# Patient Record
Sex: Female | Born: 1955 | Race: Asian | Hispanic: No | Marital: Married | Smoking: Former smoker
Health system: Southern US, Community
[De-identification: ages and names within clinical notes are randomized; demographics above are authoritative.]

## PROBLEM LIST (undated history)

## (undated) DIAGNOSIS — J301 Allergic rhinitis due to pollen: Secondary | ICD-10-CM

## (undated) DIAGNOSIS — I10 Essential (primary) hypertension: Secondary | ICD-10-CM

## (undated) HISTORY — PX: ABDOMINAL HYSTERECTOMY: SHX81

## (undated) HISTORY — PX: TUBAL LIGATION: SHX77

## (undated) HISTORY — DX: Essential (primary) hypertension: I10

## (undated) HISTORY — DX: Allergic rhinitis due to pollen: J30.1

---

## 1985-10-14 HISTORY — PX: ANKLE FRACTURE SURGERY: SHX122

## 2008-10-14 DIAGNOSIS — I10 Essential (primary) hypertension: Secondary | ICD-10-CM

## 2008-10-14 HISTORY — DX: Essential (primary) hypertension: I10

## 2012-10-14 HISTORY — PX: OTHER SURGICAL HISTORY: SHX169

## 2012-10-14 HISTORY — PX: BASAL CELL CARCINOMA EXCISION: SHX1214

## 2013-02-10 HISTORY — PX: OTHER SURGICAL HISTORY: SHX169

## 2014-10-14 HISTORY — PX: CARPAL TUNNEL RELEASE: SHX101

## 2015-06-23 ENCOUNTER — Telehealth: Payer: Self-pay | Admitting: Internal Medicine

## 2015-06-23 NOTE — Telephone Encounter (Signed)
Pt called stated she thinks she has a fibroid tumer or her right shoulder blade. It is causing her problems when she tries to sleep and impairing right arm movement and when she moves her head forward she can feel something pulling Wanted to know if she could be see sooner or is it ok to wait till oct to have this taken care of  i asked Sukaina t she checked with Mearl Latin and Rena  re[facility] to [facility] on [address]  i told pt that and gave her address and phone number to the office on [address]  She wanted to know if she still could see dr Silvio Pate in oct. i told patient her appointment was here oct but that [facility] recommended her to go to urgent care because this is a more complicated acute need prior to oct and that dr Silvio Pate could see what they did at urgent care Dr Silvio Pate and Karena Addison out of office today

## 2015-06-23 NOTE — Telephone Encounter (Signed)
Please check with her on Monday We can try to get her in sooner if she still has an issue that can't wait till October

## 2015-06-24 ENCOUNTER — Ambulatory Visit (INDEPENDENT_AMBULATORY_CARE_PROVIDER_SITE_OTHER): Payer: Self-pay | Admitting: Family Medicine

## 2015-06-24 VITALS — BP 150/84 | HR 73 | Temp 98.5°F | Resp 18 | Ht 60.5 in | Wt 148.6 lb

## 2015-06-24 DIAGNOSIS — IMO0001 Reserved for inherently not codable concepts without codable children: Secondary | ICD-10-CM

## 2015-06-24 DIAGNOSIS — M62830 Muscle spasm of back: Secondary | ICD-10-CM

## 2015-06-24 DIAGNOSIS — R030 Elevated blood-pressure reading, without diagnosis of hypertension: Secondary | ICD-10-CM

## 2015-06-24 NOTE — Patient Instructions (Signed)
Catherine Dalton for massage therapy. Continue aspiriin as needed for pain. Return if not getting better in 2-3 months. Follow up with your PCP in October. Talk to them about blood pressure and dermatology referral.

## 2015-06-24 NOTE — Progress Notes (Signed)
[facility] [address], [place] [address] [phone]  Date:  06/24/2015   Name:  Catherine Dalton   DOB:  04-20-1956   MRN:  [id number]  PCP:  No primary care provider on file.    Chief Complaint: Shoulder Pain   History of Present Illness:  This is a 59 y.o. female who is presenting with a "knot" over her right shoulder blade x 6-8 years. She thinks this could be a "fibroid tumor". When she first noticed the knot, there was no associated pain. Over the past 3-4 years this area has become painful. Pain is not just located to knot, pain is more under right shoulder blade. She feels she can't quite locate it with her hands. Over the past 2 years she wakes every night from the pain. During the day the pain is described as dull, at night the pain is throbbing. Flexing neck produces a "pull". She feels the knot has grown "only a little" since she first noticed it years ago. She takes aspirin as needed, usually at night to help her sleep. She states "I am anti-medicine". 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 She always attributed the pain to work but now that she is not working, she is worried since the pain is still there. She is the primary caregiver for her aging mother and states this does cause her a lot of stress. She works in her garden every day and states this is a stress reliever for her.  Pt has elevated bp to 150/84. She states she was tried on medication for BP (maybe metoprolol) a couple years ago but made her too drowsy so she stopped. She does not want to take medication if she can help it. She is trying to lose some weight and eat healthier and hoping this will help. She has not had a CPE in almost 2 years. She is establishing care with a PCP, Dr. Silvio Pate, in 1 month. She has a BP monitor at home that she uses for her Mom. She denies CP, SOB, palpitations, headaches, dizziness, blurred vision.  Review of Systems:  Review of Systems See  HPI  There are no active problems to display for this patient.   Prior to Admission medications   Medication Sig Start Date End Date Taking? Authorizing Provider  aspirin 325 MG EC tablet Take 325 mg by mouth daily.   Yes Historical Provider, MD    No Known Allergies  Past Surgical History  Procedure Laterality Date  . Ankle fracture surgery  1987  . Carpal tunnel release    . Tubal ligation    . Abdominal hysterectomy      Social History  Substance Use Topics  . Smoking status: Former Research scientist (life sciences)  . Smokeless tobacco: None  . Alcohol Use: No    Family History  Problem Relation Age of Onset  . Heart disease Mother   . Stroke Mother   . Hypertension Mother   . Hyperlipidemia Mother   . Cancer Sister     Medication list has been reviewed and updated.  Physical Examination:  Physical Exam  Constitutional: She is oriented to person, place, and time. She appears well-developed and well-nourished. No distress.  HENT:  Head: Normocephalic and atraumatic.  Right Ear: Hearing normal.  Left Ear: Hearing normal.  Nose: Nose normal.  Eyes: Conjunctivae and lids are normal. Right eye exhibits no discharge. Left eye exhibits no discharge. No scleral icterus.  Neck:  Carotid bruit is not present.  Cardiovascular: Normal rate, regular rhythm, normal heart sounds and normal pulses.   No murmur heard. Pulmonary/Chest: Effort normal and breath sounds normal. No respiratory distress. She has no wheezes. She has no rhonchi. She has no rales.  Musculoskeletal:       Right shoulder: Normal. She exhibits normal range of motion.       Left shoulder: Normal. She exhibits normal range of motion.       Cervical back: Normal. She exhibits normal range of motion.       Thoracic back: She exhibits normal range of motion.       Lumbar back: Normal. She exhibits normal range of motion.  Right upper back with 1 cm soft sebaceous cyst. Small amount sebaceous material expressed from pore. Mildly  tender with palpation.  Mild tenderness at medial aspect of right scapula. Spasm present at medial border.   Neurological: She is alert and oriented to person, place, and time.  Skin: Skin is warm, dry and intact.  No LE edema  Psychiatric: She has a normal mood and affect. Her speech is normal and behavior is normal. Thought content normal.   BP 150/84 mmHg  Pulse 73  Temp(Src) 98.5 F (36.9 C) (Oral)  Resp 18  Ht 5' 0.5" (1.537 m)  Wt 148 lb 9.6 oz (67.405 kg)  BMI 28.53 kg/m2  SpO2 98%  Assessment and Plan:  1. Muscle spasm of back Discussed patient with Dr. Brigitte Pulse. Suspect patient holds tension between scapula and pain is d/t muscle spasm. She takes care of her mother which causes her a lot of stress. Unlikely that sebaceous cyst is causing pain and removal would likely be of benefit. She declined prescription for muscle relaxer or stronger NSAID. She will continue aspirin prn pain. We discussed benefits of massage therapy and acupuncture as she is more interested in homeopathic medicine. Follow up with PCP if pain not improving in 2-3 months.  2. Elevated BP Mildly elevated BP. Patient does not want to start medication at this time. She has a BP monitor at home and will start monitoring daily. She will focus on low salt diet, exercise and weight loss. She will bring BP readings to appt with PCP in October to discuss.   Catherine Dalton Catherine Dalton, MHS [facility]  06/24/2015

## 2015-06-26 NOTE — Telephone Encounter (Signed)
Okay, good to hear

## 2015-06-26 NOTE — Telephone Encounter (Signed)
I spoke to patient.  She went to Urgent Care and they told her it's muscle tension. Patient said she's fine to wait until October for her appointment.

## 2015-06-26 NOTE — Progress Notes (Signed)
Patient ID: Catherine Dalton, female   DOB: 08-25-1956, 59 y.o.   MRN: [id number] Pt assessed, reviewed documentation and agree w/ assessment and plan. Delman Cheadle, MD MPH

## 2015-08-02 ENCOUNTER — Ambulatory Visit (INDEPENDENT_AMBULATORY_CARE_PROVIDER_SITE_OTHER): Payer: Self-pay | Admitting: Internal Medicine

## 2015-08-02 ENCOUNTER — Encounter: Payer: Self-pay | Admitting: Internal Medicine

## 2015-08-02 VITALS — BP 146/96 | HR 78 | Temp 97.5°F | Ht 61.0 in | Wt 151.0 lb

## 2015-08-02 DIAGNOSIS — I10 Essential (primary) hypertension: Secondary | ICD-10-CM

## 2015-08-02 DIAGNOSIS — Z0000 Encounter for general adult medical examination without abnormal findings: Secondary | ICD-10-CM

## 2015-08-02 DIAGNOSIS — Z23 Encounter for immunization: Secondary | ICD-10-CM

## 2015-08-02 MED ORDER — LOSARTAN POTASSIUM-HCTZ 50-12.5 MG PO TABS: 1.0000 | ORAL_TABLET | Freq: Every day | ORAL | Status: DC

## 2015-08-02 NOTE — Progress Notes (Signed)
Pre visit review using our clinic review tool, if applicable. No additional management support is needed unless otherwise documented below in the visit note. 

## 2015-08-02 NOTE — Progress Notes (Signed)
Subjective:    Patient ID: Catherine Dalton, female    DOB: 10/27/1955, 59 y.o.   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   No current facility-administered medications on file prior to visit.    No Known Allergies  Past Medical History  Diagnosis Date  . Essential hypertension, benign 2010    didn't tolerate meds    Past Surgical History  Procedure Laterality Date  . Ankle fracture surgery  1987  . Carpal tunnel release  2016    Dr Ninfa Linden  . Tubal ligation    . Abdominal hysterectomy      Fibroids    Family History  Problem Relation Age of Onset  . Heart disease Mother   . Stroke Mother   . Hypertension Mother   . Hyperlipidemia Mother   . Cancer Sister     breast cancer  . Asthma Father   . Cancer Paternal Grandmother     colon cancer    Social History   Social History  . Marital Status: Married    Spouse Name: N/A  . Number of Children: 3  . Years of Education: N/A   Occupational History  . [email or web address]     Retired   Social History Main Topics  . Smoking status: Former Research scientist (life sciences)  . Smokeless tobacco: Never Used  . Alcohol Use: 0.0 oz/week    0 Standard drinks or equivalent per week     Comment: rare glass of wine  . Drug Use: No  . Sexual Activity: Yes   Other Topics Concern  . Not on file   Social History Narrative   Review of Systems  Constitutional: Positive for unexpected weight change. Negative for fatigue.       Landscaping and gardening every day Gained 20# since retirement Wears seat belt  HENT: Positive for hearing loss.  Negative for dental problem.   Eyes: Negative for visual disturbance.       No diplopia or unilateral vision loss  Respiratory: Positive for cough. Negative for chest tightness and shortness of breath.        Occ cough from drainage  Cardiovascular: Negative for chest pain, palpitations and leg swelling.  Gastrointestinal: Negative for nausea, vomiting, abdominal pain, constipation and blood in stool.  Genitourinary: Negative for dysuria, hematuria and dyspareunia.  Musculoskeletal: Positive for back pain and arthralgias.       Mild spondylosis in back Mild hand pains  Skin: Negative for rash.  Allergic/Immunologic: Positive for environmental allergies. Negative for immunocompromised state.       Hay fever--uses loratadine  Neurological: Negative for dizziness and light-headedness.       Rare headaches-- ?if angry (BP up??)  Psychiatric/Behavioral: Negative for sleep disturbance and dysphoric mood. The patient is not nervous/anxious.        Objective:   Physical Exam  Constitutional: She is oriented to person, place, and time. She appears well-nourished. No distress.  HENT:  Head: Normocephalic and atraumatic.  Right Ear: External ear normal.  Left Ear: External ear normal.  Mouth/Throat: Oropharynx is clear and moist. No oropharyngeal  exudate.  Eyes: Conjunctivae and EOM are normal. Pupils are equal, round, and reactive to light.  Neck: Normal range of motion. Neck supple. No thyromegaly present.  Cardiovascular: Normal rate, regular rhythm, normal heart sounds and intact distal pulses.  Exam reveals no gallop.   No murmur heard. Pulmonary/Chest: Effort normal and breath sounds normal. No respiratory distress. She has no wheezes. She has no rales.  Abdominal: Soft. There is no tenderness.  Musculoskeletal: She exhibits no edema or tenderness.  Lymphadenopathy:    She has no cervical adenopathy.  Neurological: She is alert and oriented to person, place, and time.  Skin: No rash  noted. No erythema.  Psychiatric: She has a normal mood and affect. Her behavior is normal.          Assessment & Plan:

## 2015-08-02 NOTE — Assessment & Plan Note (Signed)
BP Readings from Last 3 Encounters:  08/02/15 146/96  06/24/15 150/84   She is wiling to try medication She didn't tolerate metoprolol--made her sleepy Will try losartan/HCTZ

## 2015-08-02 NOTE — Addendum Note (Signed)
Addended by: Despina Hidden on: 08/02/2015 12:25 PM   Modules accepted: Orders

## 2015-08-02 NOTE — Assessment & Plan Note (Signed)
Doesn't want cancer screening Will accept Tdap

## 2015-08-04 ENCOUNTER — Encounter: Payer: Self-pay | Admitting: Internal Medicine

## 2015-09-13 ENCOUNTER — Encounter: Payer: Self-pay | Admitting: Internal Medicine

## 2015-09-13 ENCOUNTER — Ambulatory Visit (INDEPENDENT_AMBULATORY_CARE_PROVIDER_SITE_OTHER): Payer: Self-pay | Admitting: Internal Medicine

## 2015-09-13 VITALS — BP 140/80 | HR 74 | Temp 98.1°F | Wt 149.0 lb

## 2015-09-13 DIAGNOSIS — I10 Essential (primary) hypertension: Secondary | ICD-10-CM

## 2015-09-13 LAB — CBC WITH DIFFERENTIAL/PLATELET
BASOS PCT: 0.8 % (ref 0.0–3.0)
Basophils Absolute: 0.1 10*3/uL (ref 0.0–0.1)
EOS PCT: 4 % (ref 0.0–5.0)
Eosinophils Absolute: 0.2 10*3/uL (ref 0.0–0.7)
HCT: 42.9 % (ref 36.0–46.0)
Hemoglobin: 14.6 g/dL (ref 12.0–15.0)
LYMPHS ABS: 2 10*3/uL (ref 0.7–4.0)
Lymphocytes Relative: 33.2 % (ref 12.0–46.0)
MCHC: 34 g/dL (ref 30.0–36.0)
MCV: 88.6 fl (ref 78.0–100.0)
MONO ABS: 0.4 10*3/uL (ref 0.1–1.0)
Monocytes Relative: 7.1 % (ref 3.0–12.0)
NEUTROS PCT: 54.9 % (ref 43.0–77.0)
Neutro Abs: 3.3 10*3/uL (ref 1.4–7.7)
Platelets: 311 10*3/uL (ref 150.0–400.0)
RBC: 4.85 Mil/uL (ref 3.87–5.11)
RDW: 13.3 % (ref 11.5–15.5)
WBC: 6.1 10*3/uL (ref 4.0–10.5)

## 2015-09-13 LAB — COMPREHENSIVE METABOLIC PANEL
ALT: 16 U/L (ref 0–35)
AST: 18 U/L (ref 0–37)
Albumin: 4.3 g/dL (ref 3.5–5.2)
Alkaline Phosphatase: 86 U/L (ref 39–117)
BILIRUBIN TOTAL: 1 mg/dL (ref 0.2–1.2)
BUN: 15 mg/dL (ref 6–23)
CHLORIDE: 98 meq/L (ref 96–112)
CO2: 30 meq/L (ref 19–32)
Calcium: 9.8 mg/dL (ref 8.4–10.5)
Creatinine, Ser: 0.7 mg/dL (ref 0.40–1.20)
GFR: 90.22 mL/min (ref >60.00)
GLUCOSE: 105 mg/dL — AB (ref 70–99)
POTASSIUM: 4.1 meq/L (ref 3.5–5.1)
Sodium: 136 mEq/L (ref 135–145)
Total Protein: 7.6 g/dL (ref 6.0–8.3)

## 2015-09-13 LAB — LIPID PANEL
Cholesterol: 265 mg/dL — ABNORMAL HIGH (ref 0–200)
HDL: 58.3 mg/dL (ref >39.00)
LDL Cholesterol: 173 mg/dL — ABNORMAL HIGH (ref 0–99)
NONHDL: 206.8
Total CHOL/HDL Ratio: 5
Triglycerides: 168 mg/dL — ABNORMAL HIGH (ref 0.0–149.0)
VLDL: 33.6 mg/dL (ref 0.0–40.0)

## 2015-09-13 LAB — T4, FREE: FREE T4: 0.84 ng/dL (ref 0.60–1.60)

## 2015-09-13 MED ORDER — LOSARTAN POTASSIUM-HCTZ 50-12.5 MG PO TABS: 1.0000 | ORAL_TABLET | Freq: Every day | ORAL | Status: DC

## 2015-09-13 NOTE — Assessment & Plan Note (Signed)
BP Readings from Last 3 Encounters:  09/13/15 140/80  08/02/15 146/96  06/24/15 150/84   Doing well on the med Will check labs Discussed ongoing lifestyle changes

## 2015-09-13 NOTE — Progress Notes (Signed)
Pre visit review using our clinic review tool, if applicable. No additional management support is needed unless otherwise documented below in the visit note. 

## 2015-09-13 NOTE — Progress Notes (Signed)
   Subjective:    Patient ID: Catherine Dalton, female    DOB: 1956/09/09, 59 y.o.   MRN: [id number]  HPI Here for follow up of HTN  No problems with the medication Had some lethargy just the first day Takes it 6PM and does well with that  No headaches No chest pain No SOB Walking regularly--no change in stamina No edema No dizziness or orthostasis  Current Outpatient Prescriptions on File Prior to Visit  Medication Sig Dispense Refill  . aspirin 325 MG tablet Take 325 mg by mouth daily as needed. For pain    . losartan-hydrochlorothiazide (HYZAAR) 50-12.5 MG tablet Take 1 tablet by mouth daily. 30 tablet 11   No current facility-administered medications on file prior to visit.    No Known Allergies  Past Medical History  Diagnosis Date  . Essential hypertension, benign 2010    didn't tolerate meds  . Allergic rhinitis due to pollen     Past Surgical History  Procedure Laterality Date  . Ankle fracture surgery  1987  . Carpal tunnel release  2016    Dr Ninfa Linden  . Tubal ligation    . Abdominal hysterectomy      Fibroids  . Carotid duplex  02/10/13    Normal  . Basal cell carcinoma excision  2014    Face    Family History  Problem Relation Age of Onset  . Heart disease Mother   . Stroke Mother   . Hypertension Mother   . Hyperlipidemia Mother   . Cancer Sister     breast cancer  . Asthma Father   . Cancer Paternal Grandmother     colon cancer    Social History   Social History  . Marital Status: Married    Spouse Name: N/A  . Number of Children: 3  . Years of Education: N/A   Occupational History  . [email or web address]     Retired   Social History Main Topics  . Smoking status: Former Research scientist (life sciences)  . Smokeless tobacco: Never Used  . Alcohol Use: 0.0 oz/week    0 Standard drinks or equivalent per week     Comment: rare glass of wine  . Drug Use: No  . Sexual Activity: Yes   Other Topics Concern  . Not on file   Social History Narrative    Review of Systems  Appetite is good Weight down 2#     Objective:   Physical Exam  Constitutional: She appears well-developed and well-nourished. No distress.  Neck: Normal range of motion. Neck supple. No thyromegaly present.  Cardiovascular: Normal rate, regular rhythm and normal heart sounds.  Exam reveals no gallop.   No murmur heard. Pulmonary/Chest: Effort normal and breath sounds normal. No respiratory distress. She has no wheezes. She has no rales.  Musculoskeletal: She exhibits no edema.  Lymphadenopathy:    She has no cervical adenopathy.          Assessment & Plan:

## 2016-09-18 ENCOUNTER — Encounter: Payer: Self-pay | Admitting: Internal Medicine

## 2016-12-26 ENCOUNTER — Other Ambulatory Visit: Payer: Self-pay

## 2016-12-26 MED ORDER — LOSARTAN POTASSIUM-HCTZ 50-12.5 MG PO TABS: 1.0000 | ORAL_TABLET | Freq: Every day | ORAL | 0 refills | Status: DC

## 2016-12-26 NOTE — Telephone Encounter (Signed)
Pt has CPX scheduled on 01/01/17;out of losartan HCTZ;pt request small qty until seen to [facility]; advised pt done.

## 2017-01-01 ENCOUNTER — Encounter: Payer: Self-pay | Admitting: Internal Medicine

## 2017-01-01 ENCOUNTER — Ambulatory Visit (INDEPENDENT_AMBULATORY_CARE_PROVIDER_SITE_OTHER): Payer: Self-pay | Admitting: Internal Medicine

## 2017-01-01 VITALS — BP 122/80 | HR 75 | Temp 97.5°F | Ht 60.5 in | Wt 146.0 lb

## 2017-01-01 DIAGNOSIS — Z1211 Encounter for screening for malignant neoplasm of colon: Secondary | ICD-10-CM

## 2017-01-01 DIAGNOSIS — I10 Essential (primary) hypertension: Secondary | ICD-10-CM

## 2017-01-01 DIAGNOSIS — E785 Hyperlipidemia, unspecified: Secondary | ICD-10-CM

## 2017-01-01 DIAGNOSIS — Z0000 Encounter for general adult medical examination without abnormal findings: Secondary | ICD-10-CM

## 2017-01-01 LAB — CBC WITH DIFFERENTIAL/PLATELET
BASOS ABS: 0.1 10*3/uL (ref 0.0–0.1)
BASOS PCT: 1.6 % (ref 0.0–3.0)
EOS ABS: 0.2 10*3/uL (ref 0.0–0.7)
Eosinophils Relative: 4.3 % (ref 0.0–5.0)
HCT: 43.5 % (ref 36.0–46.0)
HEMOGLOBIN: 14.6 g/dL (ref 12.0–15.0)
Lymphocytes Relative: 31.9 % (ref 12.0–46.0)
Lymphs Abs: 1.5 10*3/uL (ref 0.7–4.0)
MCHC: 33.6 g/dL (ref 30.0–36.0)
MCV: 90.1 fl (ref 78.0–100.0)
MONO ABS: 0.3 10*3/uL (ref 0.1–1.0)
Monocytes Relative: 7.2 % (ref 3.0–12.0)
NEUTROS ABS: 2.5 10*3/uL (ref 1.4–7.7)
Neutrophils Relative %: 55 % (ref 43.0–77.0)
Platelets: 282 10*3/uL (ref 150.0–400.0)
RBC: 4.83 Mil/uL (ref 3.87–5.11)
RDW: 12.5 % (ref 11.5–15.5)
WBC: 4.6 10*3/uL (ref 4.0–10.5)

## 2017-01-01 LAB — COMPREHENSIVE METABOLIC PANEL
ALT: 17 U/L (ref 0–35)
AST: 19 U/L (ref 0–37)
Albumin: 4.3 g/dL (ref 3.5–5.2)
Alkaline Phosphatase: 81 U/L (ref 39–117)
BILIRUBIN TOTAL: 1.2 mg/dL (ref 0.2–1.2)
BUN: 11 mg/dL (ref 6–23)
CALCIUM: 9.7 mg/dL (ref 8.4–10.5)
CHLORIDE: 100 meq/L (ref 96–112)
CO2: 29 meq/L (ref 19–32)
CREATININE: 0.65 mg/dL (ref 0.40–1.20)
GFR: 97.86 mL/min (ref >60.00)
GLUCOSE: 102 mg/dL — AB (ref 70–99)
Potassium: 4 mEq/L (ref 3.5–5.1)
SODIUM: 136 meq/L (ref 135–145)
Total Protein: 7.2 g/dL (ref 6.0–8.3)

## 2017-01-01 LAB — LIPID PANEL
CHOLESTEROL: 273 mg/dL — AB (ref 0–200)
HDL: 60.9 mg/dL (ref >39.00)
LDL CALC: 174 mg/dL — AB (ref 0–99)
NonHDL: 212.19
TRIGLYCERIDES: 192 mg/dL — AB (ref 0.0–149.0)
Total CHOL/HDL Ratio: 4
VLDL: 38.4 mg/dL (ref 0.0–40.0)

## 2017-01-01 MED ORDER — LOSARTAN POTASSIUM-HCTZ 50-12.5 MG PO TABS: 1.0000 | ORAL_TABLET | Freq: Every day | ORAL | 3 refills | Status: DC

## 2017-01-01 NOTE — Patient Instructions (Signed)
DASH Eating Plan DASH stands for "Dietary Approaches to Stop Hypertension." The DASH eating plan is a healthy eating plan that has been shown to reduce high blood pressure (hypertension). It may also reduce your risk for type 2 diabetes, heart disease, and stroke. The DASH eating plan may also help with weight loss. What are tips for following this plan? General guidelines  Avoid eating more than 2,300 mg (milligrams) of salt (sodium) a day. If you have hypertension, you may need to reduce your sodium intake to 1,500 mg a day.  Limit alcohol intake to no more than 1 drink a day for nonpregnant women and 2 drinks a day for men. One drink equals 12 oz of beer, 5 oz of wine, or 1 oz of hard liquor.  Work with your health care provider to maintain a healthy body weight or to lose weight. Ask what an ideal weight is for you.  Get at least 30 minutes of exercise that causes your heart to beat faster (aerobic exercise) most days of the week. Activities may include walking, swimming, or biking.  Work with your health care provider or diet and nutrition specialist (dietitian) to adjust your eating plan to your individual calorie needs. Reading food labels  Check food labels for the amount of sodium per serving. Choose foods with less than 5 percent of the Daily Value of sodium. Generally, foods with less than 300 mg of sodium per serving fit into this eating plan.  To find whole grains, look for the word "whole" as the first word in the ingredient list. Shopping  Buy products labeled as "low-sodium" or "no salt added."  Buy fresh foods. Avoid canned foods and premade or frozen meals. Cooking  Avoid adding salt when cooking. Use salt-free seasonings or herbs instead of table salt or sea salt. Check with your health care provider or pharmacist before using salt substitutes.  Do not fry foods. Cook foods using healthy methods such as baking, boiling, grilling, and broiling instead.  Cook with  heart-healthy oils, such as olive, canola, soybean, or sunflower oil. Meal planning   Eat a balanced diet that includes: ? 5 or more servings of fruits and vegetables each day. At each meal, try to fill half of your plate with fruits and vegetables. ? Up to 6-8 servings of whole grains each day. ? Less than 6 oz of lean meat, poultry, or fish each day. A 3-oz serving of meat is about the same size as a deck of cards. One egg equals 1 oz. ? 2 servings of low-fat dairy each day. ? A serving of nuts, seeds, or beans 5 times each week. ? Heart-healthy fats. Healthy fats called Omega-3 fatty acids are found in foods such as flaxseeds and coldwater fish, like sardines, salmon, and mackerel.  Limit how much you eat of the following: ? Canned or prepackaged foods. ? Food that is high in trans fat, such as fried foods. ? Food that is high in saturated fat, such as fatty meat. ? Sweets, desserts, sugary drinks, and other foods with added sugar. ? Full-fat dairy products.  Do not salt foods before eating.  Try to eat at least 2 vegetarian meals each week.  Eat more home-cooked food and less restaurant, buffet, and fast food.  When eating at a restaurant, ask that your food be prepared with less salt or no salt, if possible. What foods are recommended? The items listed may not be a complete list. Talk with your dietitian about what   dietary choices are best for you. Grains Whole-grain or whole-wheat bread. Whole-grain or whole-wheat pasta. Brown rice. Oatmeal. Quinoa. Bulgur. Whole-grain and low-sodium cereals. Pita bread. Low-fat, low-sodium crackers. Whole-wheat flour tortillas. Vegetables Fresh or frozen vegetables (raw, steamed, roasted, or grilled). Low-sodium or reduced-sodium tomato and vegetable juice. Low-sodium or reduced-sodium tomato sauce and tomato paste. Low-sodium or reduced-sodium canned vegetables. Fruits All fresh, dried, or frozen fruit. Canned fruit in natural juice (without  added sugar). Meat and other protein foods Skinless chicken or turkey. Ground chicken or turkey. Pork with fat trimmed off. Fish and seafood. Egg whites. Dried beans, peas, or lentils. Unsalted nuts, nut butters, and seeds. Unsalted canned beans. Lean cuts of beef with fat trimmed off. Low-sodium, lean deli meat. Dairy Low-fat (1%) or fat-free (skim) milk. Fat-free, low-fat, or reduced-fat cheeses. Nonfat, low-sodium ricotta or cottage cheese. Low-fat or nonfat yogurt. Low-fat, low-sodium cheese. Fats and oils Soft margarine without trans fats. Vegetable oil. Low-fat, reduced-fat, or light mayonnaise and salad dressings (reduced-sodium). Canola, safflower, olive, soybean, and sunflower oils. Avocado. Seasoning and other foods Herbs. Spices. Seasoning mixes without salt. Unsalted popcorn and pretzels. Fat-free sweets. What foods are not recommended? The items listed may not be a complete list. Talk with your dietitian about what dietary choices are best for you. Grains Baked goods made with fat, such as croissants, muffins, or some breads. Dry pasta or rice meal packs. Vegetables Creamed or fried vegetables. Vegetables in a cheese sauce. Regular canned vegetables (not low-sodium or reduced-sodium). Regular canned tomato sauce and paste (not low-sodium or reduced-sodium). Regular tomato and vegetable juice (not low-sodium or reduced-sodium). Pickles. Olives. Fruits Canned fruit in a light or heavy syrup. Fried fruit. Fruit in cream or butter sauce. Meat and other protein foods Fatty cuts of meat. Ribs. Fried meat. Bacon. Sausage. Bologna and other processed lunch meats. Salami. Fatback. Hotdogs. Bratwurst. Salted nuts and seeds. Canned beans with added salt. Canned or smoked fish. Whole eggs or egg yolks. Chicken or turkey with skin. Dairy Whole or 2% milk, cream, and half-and-half. Whole or full-fat cream cheese. Whole-fat or sweetened yogurt. Full-fat cheese. Nondairy creamers. Whipped toppings.  Processed cheese and cheese spreads. Fats and oils Butter. Stick margarine. Lard. Shortening. Ghee. Bacon fat. Tropical oils, such as coconut, palm kernel, or palm oil. Seasoning and other foods Salted popcorn and pretzels. Onion salt, garlic salt, seasoned salt, table salt, and sea salt. Worcestershire sauce. Tartar sauce. Barbecue sauce. Teriyaki sauce. Soy sauce, including reduced-sodium. Steak sauce. Canned and packaged gravies. Fish sauce. Oyster sauce. Cocktail sauce. Horseradish that you find on the shelf. Ketchup. Mustard. Meat flavorings and tenderizers. Bouillon cubes. Hot sauce and Tabasco sauce. Premade or packaged marinades. Premade or packaged taco seasonings. Relishes. Regular salad dressings. Where to find more information:  National Heart, Lung, and Blood Institute: www.nhlbi.nih.gov  American Heart Association: www.heart.org Summary  The DASH eating plan is a healthy eating plan that has been shown to reduce high blood pressure (hypertension). It may also reduce your risk for type 2 diabetes, heart disease, and stroke.  With the DASH eating plan, you should limit salt (sodium) intake to 2,300 mg a day. If you have hypertension, you may need to reduce your sodium intake to 1,500 mg a day.  When on the DASH eating plan, aim to eat more fresh fruits and vegetables, whole grains, lean proteins, low-fat dairy, and heart-healthy fats.  Work with your health care provider or diet and nutrition specialist (dietitian) to adjust your eating plan to your individual   calorie needs. This information is not intended to replace advice given to you by your health care provider. Make sure you discuss any questions you have with your health care provider. Document Released: 09/19/2011 Document Revised: 09/23/2016 Document Reviewed: 09/23/2016 Elsevier Interactive Patient Education  2017 Elsevier Inc.  

## 2017-01-01 NOTE — Assessment & Plan Note (Signed)
Prefers no statin

## 2017-01-01 NOTE — Assessment & Plan Note (Signed)
Healthy Plans to work harder on fitness Prefers no mammograms Will do FIT after discussion

## 2017-01-01 NOTE — Assessment & Plan Note (Signed)
BP Readings from Last 3 Encounters:  01/01/17 122/80  09/13/15 140/80  08/02/15 (!) 146/96   Good control

## 2017-01-01 NOTE — Progress Notes (Signed)
Pre visit review using our clinic review tool, if applicable. No additional management support is needed unless otherwise documented below in the visit note. 

## 2017-01-01 NOTE — Progress Notes (Signed)
Subjective:    Patient ID: Catherine Dalton, female    DOB: 1956-05-21, 61 y.o.   MRN: [id number]  HPI Here for physical  Doing well Has settled in here Lives with sister and mom (who needs care) Husband had MI recently Not exercising much--discussed--but does work in garden/yard  No problems with the BP med Slight cough at times Feels her nose dry out Notes slight nosebleed if misses med Uses the aspirin only prn for pain  Current Outpatient Prescriptions on File Prior to Visit  Medication Sig Dispense Refill  . aspirin 325 MG tablet Take 325 mg by mouth daily as needed. For pain    . losartan-hydrochlorothiazide (HYZAAR) 50-12.5 MG tablet Take 1 tablet by mouth daily. 30 tablet 0   No current facility-administered medications on file prior to visit.     No Known Allergies  Past Medical History:  Diagnosis Date  . Allergic rhinitis due to pollen   . Essential hypertension, benign 2010   didn't tolerate meds    Past Surgical History:  Procedure Laterality Date  . ABDOMINAL HYSTERECTOMY     Fibroids  . [place]  . BASAL CELL CARCINOMA EXCISION  2014   Face  . Carotid duplex  02/10/13   Normal  . CARPAL TUNNEL RELEASE  2016   Dr Ninfa Linden  . TUBAL LIGATION      Family History  Problem Relation Age of Onset  . Heart disease Mother   . Stroke Mother   . Hypertension Mother   . Hyperlipidemia Mother   . Cancer Sister     breast cancer  . Asthma Father   . Cancer Paternal Grandmother     colon cancer  . Autoimmune disease Other     Social History   Social History  . Marital status: Married    Spouse name: N/A  . Number of children: 3  . Years of education: N/A   Occupational History  . [email or web address]     Retired   Social History Main Topics  . Smoking status: Former Research scientist (life sciences)  . Smokeless tobacco: Never Used  . Alcohol use 0.0 oz/week     Comment: rare glass of wine  . Drug use: No  . Sexual activity: Yes   Other  Topics Concern  . Not on file   Social History Narrative  . No narrative on file   Review of Systems  Constitutional: Negative for fatigue and unexpected weight change.       Wears seat belt  HENT: Negative for dental problem, hearing loss, tinnitus and trouble swallowing.        Keeps up with dentist  Eyes: Positive for visual disturbance.       Had floater recently--no flashes, etc No diplopia or unilateral vision loss  Respiratory: Negative for cough and chest tightness.        Did have SOB in cold weather once--"like asthma attack" (didn't limit her)  Cardiovascular: Positive for palpitations. Negative for chest pain and leg swelling.       Occ skip but no racing  Gastrointestinal: Negative for abdominal pain, blood in stool, constipation and diarrhea.       Occasional heartburn--rolaids helped  Endocrine: Negative for polydipsia and polyuria.  Genitourinary: Negative for difficulty urinating, dyspareunia, frequency and hematuria.  Musculoskeletal: Positive for arthralgias. Negative for joint swelling.       Right shoulder pain at times--crepitus Lumbar disc disease also  Skin: Negative for rash.  Dry skin No suspicious lesions  Allergic/Immunologic: Positive for environmental allergies. Negative for immunocompromised state.       Occ loratadine  Neurological: Negative for dizziness, syncope, light-headedness and headaches.  Hematological: Negative for adenopathy. Does not bruise/bleed easily.  Psychiatric/Behavioral: Negative for dysphoric mood and sleep disturbance. The patient is not nervous/anxious.        Objective:   Physical Exam  Constitutional: She is oriented to person, place, and time. She appears well-developed and well-nourished. No distress.  HENT:  Head: Normocephalic and atraumatic.  Right Ear: External ear normal.  Left Ear: External ear normal.  Mouth/Throat: Oropharynx is clear and moist. No oropharyngeal exudate.  Eyes: Conjunctivae are normal.  Pupils are equal, round, and reactive to light.  Neck: No thyromegaly present.  Cardiovascular: Normal rate, regular rhythm, normal heart sounds and intact distal pulses.  Exam reveals no gallop.   No murmur heard. Pulmonary/Chest: Effort normal and breath sounds normal. No respiratory distress. She has no wheezes. She has no rales.  Abdominal: Soft. There is no tenderness.  Musculoskeletal: She exhibits no edema or tenderness.  Lymphadenopathy:    She has no cervical adenopathy.  Neurological: She is alert and oriented to person, place, and time.  Skin: No rash noted. No erythema.  Psychiatric: She has a normal mood and affect. Her behavior is normal.          Assessment & Plan:

## 2017-01-13 ENCOUNTER — Other Ambulatory Visit (INDEPENDENT_AMBULATORY_CARE_PROVIDER_SITE_OTHER): Payer: Self-pay

## 2017-01-13 DIAGNOSIS — Z1211 Encounter for screening for malignant neoplasm of colon: Secondary | ICD-10-CM

## 2017-01-13 LAB — FECAL OCCULT BLOOD, IMMUNOCHEMICAL: FECAL OCCULT BLD: NEGATIVE

## 2017-01-14 ENCOUNTER — Encounter: Payer: Self-pay | Admitting: Internal Medicine

## 2017-03-04 ENCOUNTER — Ambulatory Visit (INDEPENDENT_AMBULATORY_CARE_PROVIDER_SITE_OTHER)
Admission: RE | Admit: 2017-03-04 | Discharge: 2017-03-04 | Disposition: A | Discharge status: Home / Self Care | Payer: Self-pay | Source: Ambulatory Visit | Attending: Internal Medicine | Admitting: Internal Medicine

## 2017-03-04 ENCOUNTER — Ambulatory Visit (INDEPENDENT_AMBULATORY_CARE_PROVIDER_SITE_OTHER): Payer: Self-pay | Admitting: Internal Medicine

## 2017-03-04 ENCOUNTER — Encounter: Payer: Self-pay | Admitting: Internal Medicine

## 2017-03-04 VITALS — BP 136/82 | HR 72 | Temp 98.1°F | Wt 150.0 lb

## 2017-03-04 DIAGNOSIS — Y92009 Unspecified place in unspecified non-institutional (private) residence as the place of occurrence of the external cause: Secondary | ICD-10-CM

## 2017-03-04 DIAGNOSIS — W19XXXA Unspecified fall, initial encounter: Secondary | ICD-10-CM

## 2017-03-04 DIAGNOSIS — M79642 Pain in left hand: Secondary | ICD-10-CM

## 2017-03-04 NOTE — Patient Instructions (Signed)
Hand Pain Many things can cause hand pain. Some common causes are:  An injury.  Repeating the same movement with your hand over and over (overuse).  Osteoporosis.  Arthritis.  Lumps in the tendons or joints of the hand and wrist (ganglion cysts).  Infection. Follow these instructions at home: Pay attention to any changes in your symptoms. Take these actions to help with your discomfort:  If directed, put ice on the affected area:  Put ice in a plastic bag.  Place a towel between your skin and the bag.  Leave the ice on for 15-20 minutes, 3?4 times a day for 2 days.  Take over-the-counter and prescription medicines only as told by your health care provider.  Minimize stress on your hands and wrists as much as possible.  Take breaks from repetitive activity often.  Do stretches as told by your health care provider.  Do not do activities that make your pain worse. Contact a health care provider if:  Your pain does not get better after a few days of self-care.  Your pain gets worse.  Your pain affects your ability to do your daily activities. Get help right away if:  Your hand becomes warm, red, or swollen.  Your hand is numb or tingling.  Your hand is extremely swollen or deformed.  Your hand or fingers turn white or blue.  You cannot move your hand, wrist, or fingers. This information is not intended to replace advice given to you by your health care provider. Make sure you discuss any questions you have with your health care provider. Document Released: 10/27/2015 Document Revised: 03/07/2016 Document Reviewed: 10/26/2014 Elsevier Interactive Patient Education  2017 [employer].

## 2017-03-04 NOTE — Progress Notes (Signed)
Subjective:    Patient ID: Catherine Dalton, female    DOB: [birth year], 61 y.o.   MRN: [id number]  HPI  Pt presents to the clinic today with c/o left hand pain. She reports this started 1 month ago after she fell outside of her home. She caught herself with her left hand. She did have pain and swelling initially. Over the last week though, the pain seems a little worse. She describes the pain as sora and achy. The pain seems worse, when she tries to push herself up with her left hand. She denies numbness and tingling. She has also noticed a small knot, just above her wrist, that are tender to touch. She has tried ice but has not taken anything OTC for this.  Review of Systems      Past Medical History:  Diagnosis Date  . Allergic rhinitis due to pollen   . Essential hypertension, benign 2010   didn't tolerate meds    Current Outpatient Prescriptions  Medication Sig Dispense Refill  . aspirin 325 MG tablet Take 325 mg by mouth daily as needed. For pain    . losartan-hydrochlorothiazide (HYZAAR) 50-12.5 MG tablet Take 1 tablet by mouth daily. 90 tablet 3   No current facility-administered medications for this visit.     No Known Allergies  Family History  Problem Relation Age of Onset  . Heart disease Mother   . Stroke Mother   . Hypertension Mother   . Hyperlipidemia Mother   . Cancer Sister        breast cancer  . Asthma Father   . Cancer Paternal Grandmother        colon cancer  . Autoimmune disease Other     Social History   Social History  . Marital status: Married    Spouse name: N/A  . Number of children: 3  . Years of education: N/A   Occupational History  . [email or web address]     Retired   Social History Main Topics  . Smoking status: Former Research scientist (life sciences)  . Smokeless tobacco: Never Used  . Alcohol use 0.0 oz/week     Comment: rare glass of wine  . Drug use: No  . Sexual activity: Yes   Other Topics Concern  . Not on file   Social History  Narrative  . No narrative on file     Constitutional: Denies fever, malaise, fatigue, headache or abrupt weight changes.  Musculoskeletal: Pt reports left hand pain. Denies difficulty with gait, muscle pain.    No other specific complaints in a complete review of systems (except as listed in HPI above).  Objective:   Physical Exam   BP 136/82   Pulse 72   Temp 98.1 F (36.7 C) (Oral)   Wt 150 lb (68 kg)   SpO2 97%   BMI 28.81 kg/m  Wt Readings from Last 3 Encounters:  03/04/17 150 lb (68 kg)  01/01/17 146 lb (66.2 kg)  09/13/15 149 lb (67.6 kg)    General: Appears her stated age, in NAD. Cardiovascular: Radial pulse 2+, LUE. Musculoskeletal: Normal flexion, extension and rotation of the wrist. Normal flexion, extension, abduction and adduction of the fingers. Hand grips equal.  Tender over the 2nd and 3rd metacarpals.  Neurological: Sensation intact to LUE.  BMET    Component Value Date/Time   NA 136 01/01/2017 1122   K 4.0 01/01/2017 1122   CL 100 01/01/2017 1122   CO2 29 01/01/2017 1122   GLUCOSE 102 (H)  01/01/2017 1122   BUN 11 01/01/2017 1122   CREATININE 0.65 01/01/2017 1122   CALCIUM 9.7 01/01/2017 1122    Lipid Panel     Component Value Date/Time   CHOL 273 (H) 01/01/2017 1122   TRIG 192.0 (H) 01/01/2017 1122   HDL 60.90 01/01/2017 1122   CHOLHDL 4 01/01/2017 1122   VLDL 38.4 01/01/2017 1122   LDLCALC 174 (H) 01/01/2017 1122    CBC    Component Value Date/Time   WBC 4.6 01/01/2017 1122   RBC 4.83 01/01/2017 1122   HGB 14.6 01/01/2017 1122   HCT 43.5 01/01/2017 1122   PLT 282.0 01/01/2017 1122   MCV 90.1 01/01/2017 1122   MCHC 33.6 01/01/2017 1122   RDW 12.5 01/01/2017 1122   LYMPHSABS 1.5 01/01/2017 1122   MONOABS 0.3 01/01/2017 1122   EOSABS 0.2 01/01/2017 1122   BASOSABS 0.1 01/01/2017 1122    Hgb A1C No results found for: HGBA1C         Assessment & Plan:   Left Hand Pain s/p Fall at Home:  Xray of left hand  today Advised her to try Ibuprofen if needed for pain May need referral to ortho, pt request Dr. Katy Fitch  Will follow up after xray, return precautions discussed Webb Silversmith, NP

## 2017-03-05 ENCOUNTER — Telehealth: Payer: Self-pay | Admitting: Internal Medicine

## 2017-03-05 NOTE — Telephone Encounter (Signed)
Pt returned your call.  

## 2017-03-06 ENCOUNTER — Ambulatory Visit: Payer: Self-pay | Admitting: Family Medicine

## 2018-01-06 ENCOUNTER — Encounter: Payer: Self-pay | Admitting: Internal Medicine

## 2018-05-13 ENCOUNTER — Encounter: Payer: Self-pay | Admitting: Internal Medicine

## 2018-05-13 ENCOUNTER — Ambulatory Visit: Payer: Self-pay | Admitting: Internal Medicine

## 2018-05-13 VITALS — BP 138/90 | HR 76 | Temp 98.3°F | Ht 60.0 in | Wt 150.0 lb

## 2018-05-13 DIAGNOSIS — Z0000 Encounter for general adult medical examination without abnormal findings: Secondary | ICD-10-CM

## 2018-05-13 DIAGNOSIS — Z1211 Encounter for screening for malignant neoplasm of colon: Secondary | ICD-10-CM

## 2018-05-13 DIAGNOSIS — E785 Hyperlipidemia, unspecified: Secondary | ICD-10-CM

## 2018-05-13 DIAGNOSIS — I10 Essential (primary) hypertension: Secondary | ICD-10-CM

## 2018-05-13 LAB — CBC
HEMATOCRIT: 43.5 % (ref 36.0–46.0)
HEMOGLOBIN: 14.6 g/dL (ref 12.0–15.0)
MCHC: 33.7 g/dL (ref 30.0–36.0)
MCV: 89.9 fl (ref 78.0–100.0)
PLATELETS: 296 10*3/uL (ref 150.0–400.0)
RBC: 4.84 Mil/uL (ref 3.87–5.11)
RDW: 13.1 % (ref 11.5–15.5)
WBC: 5.9 10*3/uL (ref 4.0–10.5)

## 2018-05-13 LAB — COMPREHENSIVE METABOLIC PANEL
ALBUMIN: 4.4 g/dL (ref 3.5–5.2)
ALK PHOS: 93 U/L (ref 39–117)
ALT: 18 U/L (ref 0–35)
AST: 18 U/L (ref 0–37)
BUN: 14 mg/dL (ref 6–23)
CO2: 31 mEq/L (ref 19–32)
CREATININE: 0.66 mg/dL (ref 0.40–1.20)
Calcium: 9.7 mg/dL (ref 8.4–10.5)
Chloride: 103 mEq/L (ref 96–112)
GFR: 95.73 mL/min (ref >60.00)
Glucose, Bld: 105 mg/dL — ABNORMAL HIGH (ref 70–99)
Potassium: 4.3 mEq/L (ref 3.5–5.1)
SODIUM: 140 meq/L (ref 135–145)
TOTAL PROTEIN: 7.5 g/dL (ref 6.0–8.3)
Total Bilirubin: 1.3 mg/dL — ABNORMAL HIGH (ref 0.2–1.2)

## 2018-05-13 LAB — LIPID PANEL
CHOLESTEROL: 273 mg/dL — AB (ref 0–200)
HDL: 70.9 mg/dL (ref >39.00)
LDL Cholesterol: 164 mg/dL — ABNORMAL HIGH (ref 0–99)
NonHDL: 202.22
Total CHOL/HDL Ratio: 4
Triglycerides: 192 mg/dL — ABNORMAL HIGH (ref 0.0–149.0)
VLDL: 38.4 mg/dL (ref 0.0–40.0)

## 2018-05-13 NOTE — Patient Instructions (Addendum)
I would really recommend that you get a screening mammogram.  DASH Eating Plan DASH stands for "Dietary Approaches to Stop Hypertension." The DASH eating plan is a healthy eating plan that has been shown to reduce high blood pressure (hypertension). It may also reduce your risk for type 2 diabetes, heart disease, and stroke. The DASH eating plan may also help with weight loss. What are tips for following this plan? General guidelines  Avoid eating more than 2,300 mg (milligrams) of salt (sodium) a day. If you have hypertension, you may need to reduce your sodium intake to 1,500 mg a day.  Limit alcohol intake to no more than 1 drink a day for nonpregnant women and 2 drinks a day for men. One drink equals 12 oz of beer, 5 oz of wine, or 1 oz of hard liquor.  Work with your health care provider to maintain a healthy body weight or to lose weight. Ask what an ideal weight is for you.  Get at least 30 minutes of exercise that causes your heart to beat faster (aerobic exercise) most days of the week. Activities may include walking, swimming, or biking.  Work with your health care provider or diet and nutrition specialist (dietitian) to adjust your eating plan to your individual calorie needs. Reading food labels  Check food labels for the amount of sodium per serving. Choose foods with less than 5 percent of the Daily Value of sodium. Generally, foods with less than 300 mg of sodium per serving fit into this eating plan.  To find whole grains, look for the word "whole" as the first word in the ingredient list. Shopping  Buy products labeled as "low-sodium" or "no salt added."  Buy fresh foods. Avoid canned foods and premade or frozen meals. Cooking  Avoid adding salt when cooking. Use salt-free seasonings or herbs instead of table salt or sea salt. Check with your health care provider or pharmacist before using salt substitutes.  Do not fry foods. Cook foods using healthy methods such as  baking, boiling, grilling, and broiling instead.  Cook with heart-healthy oils, such as olive, canola, soybean, or sunflower oil. Meal planning   Eat a balanced diet that includes: ? 5 or more servings of fruits and vegetables each day. At each meal, try to fill half of your plate with fruits and vegetables. ? Up to 6-8 servings of whole grains each day. ? Less than 6 oz of lean meat, poultry, or fish each day. A 3-oz serving of meat is about the same size as a deck of cards. One egg equals 1 oz. ? 2 servings of low-fat dairy each day. ? A serving of nuts, seeds, or beans 5 times each week. ? Heart-healthy fats. Healthy fats called Omega-3 fatty acids are found in foods such as flaxseeds and coldwater fish, like sardines, salmon, and mackerel.  Limit how much you eat of the following: ? Canned or prepackaged foods. ? Food that is high in trans fat, such as fried foods. ? Food that is high in saturated fat, such as fatty meat. ? Sweets, desserts, sugary drinks, and other foods with added sugar. ? Full-fat dairy products.  Do not salt foods before eating.  Try to eat at least 2 vegetarian meals each week.  Eat more home-cooked food and less restaurant, buffet, and fast food.  When eating at a restaurant, ask that your food be prepared with less salt or no salt, if possible. What foods are recommended? The items listed may  not be a complete list. Talk with your dietitian about what dietary choices are best for you. Grains Whole-grain or whole-wheat bread. Whole-grain or whole-wheat pasta. Brown rice. [place]. Bulgur. Whole-grain and low-sodium cereals. Pita bread. Low-fat, low-sodium crackers. Whole-wheat flour tortillas. Vegetables Fresh or frozen vegetables (raw, steamed, roasted, or grilled). Low-sodium or reduced-sodium tomato and vegetable juice. Low-sodium or reduced-sodium tomato sauce and tomato paste. Low-sodium or reduced-sodium canned vegetables. Fruits All fresh,  dried, or frozen fruit. Canned fruit in natural juice (without added sugar). Meat and other protein foods Skinless chicken or [place]. Ground chicken or [place]. Pork with fat trimmed off. Fish and seafood. Egg whites. Dried beans, peas, or lentils. Unsalted nuts, nut butters, and seeds. Unsalted canned beans. Lean cuts of beef with fat trimmed off. Low-sodium, lean deli meat. Dairy Low-fat (1%) or fat-free (skim) milk. Fat-free, low-fat, or reduced-fat cheeses. Nonfat, low-sodium ricotta or cottage cheese. Low-fat or nonfat yogurt. Low-fat, low-sodium cheese. Fats and oils Soft margarine without trans fats. Vegetable oil. Low-fat, reduced-fat, or light mayonnaise and salad dressings (reduced-sodium). Canola, safflower, olive, soybean, and sunflower oils. Avocado. Seasoning and other foods Herbs. Spices. Seasoning mixes without salt. Unsalted popcorn and pretzels. Fat-free sweets. What foods are not recommended? The items listed may not be a complete list. Talk with your dietitian about what dietary choices are best for you. Grains Baked goods made with fat, such as croissants, muffins, or some breads. Dry pasta or rice meal packs. Vegetables Creamed or fried vegetables. Vegetables in a cheese sauce. Regular canned vegetables (not low-sodium or reduced-sodium). Regular canned tomato sauce and paste (not low-sodium or reduced-sodium). Regular tomato and vegetable juice (not low-sodium or reduced-sodium). Angie Fava. Olives. Fruits Canned fruit in a light or heavy syrup. Fried fruit. Fruit in cream or butter sauce. Meat and other protein foods Fatty cuts of meat. Ribs. Fried meat. Berniece Salines. Sausage. Bologna and other processed lunch meats. Salami. Fatback. Hotdogs. Bratwurst. Salted nuts and seeds. Canned beans with added salt. Canned or smoked fish. Whole eggs or egg yolks. Chicken or [place] with skin. Dairy Whole or 2% milk, cream, and half-and-half. Whole or full-fat cream cheese. Whole-fat or sweetened  yogurt. Full-fat cheese. Nondairy creamers. Whipped toppings. Processed cheese and cheese spreads. Fats and oils Butter. Stick margarine. Lard. Shortening. Ghee. Bacon fat. Tropical oils, such as coconut, palm kernel, or palm oil. Seasoning and other foods Salted popcorn and pretzels. Onion salt, garlic salt, seasoned salt, table salt, and sea salt. Worcestershire sauce. Tartar sauce. Barbecue sauce. Teriyaki sauce. Soy sauce, including reduced-sodium. Steak sauce. Canned and packaged gravies. Fish sauce. Oyster sauce. Cocktail sauce. Horseradish that you find on the shelf. Ketchup. Mustard. Meat flavorings and tenderizers. Bouillon cubes. Hot sauce and Tabasco sauce. Premade or packaged marinades. Premade or packaged taco seasonings. Relishes. Regular salad dressings. Where to find more information:  National Heart, Lung, and [place]: https://wilson-eaton.com/  American Heart Association: www.heart.org Summary  The DASH eating plan is a healthy eating plan that has been shown to reduce high blood pressure (hypertension). It may also reduce your risk for type 2 diabetes, heart disease, and stroke.  With the DASH eating plan, you should limit salt (sodium) intake to 2,300 mg a day. If you have hypertension, you may need to reduce your sodium intake to 1,500 mg a day.  When on the DASH eating plan, aim to eat more fresh fruits and vegetables, whole grains, lean proteins, low-fat dairy, and heart-healthy fats.  Work with your health care provider or diet and  nutrition specialist (dietitian) to adjust your eating plan to your individual calorie needs. This information is not intended to replace advice given to you by your health care provider. Make sure you discuss any questions you have with your health care provider. Document Released: 09/19/2011 Document Revised: 09/23/2016 Document Reviewed: 09/23/2016 Elsevier Interactive Patient Education  [employer].

## 2018-05-13 NOTE — Assessment & Plan Note (Signed)
Not considering medications

## 2018-05-13 NOTE — Progress Notes (Signed)
Subjective:    Patient ID: Catherine Dalton, female    DOB: 11-Jul-1956, 62 y.o.   MRN: [id number]  HPI Here for physical  Stopped the BP med --stopped it after my last visit It made her sleepy---which she could work through (metoprolol was even worse then the losartan) Usually BP 130/85 when she checks Did get her weight down--then mom died, dog needed, surgery, cruise (so weight back where it was again)  Uses ASA prn for pain only (like one a month or so)  Mouth dry in AM---mouth breaths Right tonsil irritated in AM Some heartburn---if she eats pepperoni  . Current Outpatient Medications on File Prior to Visit  Medication Sig Dispense Refill  . aspirin 325 MG tablet Take 325 mg by mouth daily as needed. For pain     No current facility-administered medications on file prior to visit.     No Known Allergies  Past Medical History:  Diagnosis Date  . Allergic rhinitis due to pollen   . Essential hypertension, benign 2010   didn't tolerate meds    Past Surgical History:  Procedure Laterality Date  . ABDOMINAL HYSTERECTOMY     Fibroids  . [place]  . BASAL CELL CARCINOMA EXCISION  2014   Face  . Carotid duplex  02/10/13   Normal  . CARPAL TUNNEL RELEASE  2016   Dr Ninfa Linden  . Echo/stress test  2014   Normal  . TUBAL LIGATION      Family History  Problem Relation Age of Onset  . Heart disease Mother   . Stroke Mother   . Hypertension Mother   . Hyperlipidemia Mother   . Cancer Sister        breast cancer  . Asthma Father   . Heart disease Brother   . Cancer Paternal Grandmother        colon cancer  . Autoimmune disease Other     Social History   Socioeconomic History  . Marital status: Married    Spouse name: Not on file  . Number of children: 3  . Years of education: Not on file  . Highest education level: Not on file  Occupational History  . Occupation: [employer]    Comment: Retired  Scientific laboratory technician  . Financial  resource strain: Not on file  . Food insecurity:    Worry: Not on file    Inability: Not on file  . Transportation needs:    Medical: Not on file    Non-medical: Not on file  Tobacco Use  . Smoking status: Former Research scientist (life sciences)  . Smokeless tobacco: Never Used  Substance and Sexual Activity  . Alcohol use: Yes    Alcohol/week: 0.0 oz    Comment: rare glass of wine  . Drug use: No  . Sexual activity: Yes  Lifestyle  . Physical activity:    Days per week: Not on file    Minutes per session: Not on file  . Stress: Not on file  Relationships  . Social connections:    Talks on phone: Not on file    Gets together: Not on file    Attends religious service: Not on file    Active member of club or organization: Not on file    Attends meetings of clubs or organizations: Not on file    Relationship status: Not on file  . Intimate partner violence:    Fear of current or ex partner: Not on file    Emotionally  abused: Not on file    Physically abused: Not on file    Forced sexual activity: Not on file  Other Topics Concern  . Not on file  Social History Narrative  . Not on file   Review of Systems  Constitutional: Negative for fatigue and unexpected weight change.       Wears seat belt  HENT: Positive for tinnitus. Negative for dental problem and trouble swallowing.        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        Occ dry cough---clears throat  Cardiovascular: Negative for chest pain, palpitations and leg swelling.       Does have rare sense that chest "has funny fish flop"--only at rest  Gastrointestinal: Negative for blood in stool and constipation.  Endocrine: Negative for polydipsia and polyuria.  Genitourinary: Negative for dyspareunia, dysuria and hematuria.  Musculoskeletal: Negative for joint swelling.       Occasional back  pain--diagnosed with spondylosis  Skin: Negative for rash.  Allergic/Immunologic: Positive for environmental allergies. Negative for immunocompromised state.       Takes OTC cetirizine (may switch to loratadine)  Neurological: Negative for dizziness, syncope and light-headedness.       Rare headaches  Hematological: Negative for adenopathy. Does not bruise/bleed easily.  Psychiatric/Behavioral: Negative for dysphoric mood and sleep disturbance. The patient is not nervous/anxious.        Objective:   Physical Exam  Constitutional: She is oriented to person, place, and time. She appears well-developed. No distress.  HENT:  Head: Normocephalic and atraumatic.  Right Ear: External ear normal.  Left Ear: External ear normal.  Mouth/Throat: Oropharynx is clear and moist. No oropharyngeal exudate.  Eyes: Pupils are equal, round, and reactive to light. Conjunctivae are normal.  Neck: No thyromegaly present.  Cardiovascular: Normal rate, regular rhythm, normal heart sounds and intact distal pulses. Exam reveals no gallop.  No murmur heard. Respiratory: Effort normal and breath sounds normal. No respiratory distress. She has no wheezes. She has no rales.  GI: Soft. There is no tenderness.  Musculoskeletal: She exhibits no edema or tenderness.  Lymphadenopathy:    She has no cervical adenopathy.  Neurological: She is alert and oriented to person, place, and time.  Skin: No rash noted.  Benign keratosis right shoulder  Psychiatric: She has a normal mood and affect.           Assessment & Plan:

## 2018-05-13 NOTE — Assessment & Plan Note (Signed)
BP Readings from Last 3 Encounters:  05/13/18 138/90  03/04/17 136/82  01/01/17 122/80   Acceptable off meds Discussed lifestyle measures

## 2018-05-13 NOTE — Assessment & Plan Note (Signed)
Healthy Will do FIT Yearly flu vaccine Urged her to get mammogram---she will consider No pap due to hyster

## 2018-05-18 ENCOUNTER — Encounter: Payer: Self-pay | Admitting: Internal Medicine

## 2018-05-18 ENCOUNTER — Ambulatory Visit: Payer: Self-pay | Admitting: Internal Medicine

## 2018-05-18 VITALS — BP 142/98 | HR 93 | Temp 98.4°F | Ht 60.0 in | Wt 153.0 lb

## 2018-05-18 DIAGNOSIS — M7521 Bicipital tendinitis, right shoulder: Secondary | ICD-10-CM

## 2018-05-18 NOTE — Assessment & Plan Note (Signed)
Doesn't seem to have torn rotator cuff and no clear significant arthritis No surgical issues Severe restriction in ROM is likely just due to pain Discussed ibuprofen and heat Consider PT if not improving over the next week

## 2018-05-18 NOTE — Patient Instructions (Signed)
Get over the counter ibuprofen (200mg ) and take 4 tabs three times a day with each meal for the next week or two. Try heat since that helps We will consider physical therapy if you are not much better by next week.

## 2018-05-18 NOTE — Progress Notes (Signed)
Subjective:    Patient ID: Catherine Dalton, female    DOB: 1956/06/07, 62 y.o.   MRN: [id number]  HPI Here due to shoulder pain  Old injury in 2012---?torn rotator cuff at work  Was cleaning house--right hand dominant Started burning but kept going Then the pain got very bad Severe pain at biceps insertion Throbbing, burning--- up to neck and down into back  Hard to abduct at all Using ibuprofen since yesterday---has helped (800mg ) Heat feels good---like in shower Cold air makes it worse  Current Outpatient Medications on File Prior to Visit  Medication Sig Dispense Refill  . aspirin 325 MG tablet Take 325 mg by mouth daily as needed. For pain     No current facility-administered medications on file prior to visit.     No Known Allergies  Past Medical History:  Diagnosis Date  . Allergic rhinitis due to pollen   . Essential hypertension, benign 2010   didn't tolerate meds    Past Surgical History:  Procedure Laterality Date  . ABDOMINAL HYSTERECTOMY     Fibroids  . [place]  . BASAL CELL CARCINOMA EXCISION  2014   Face  . Carotid duplex  02/10/13   Normal  . CARPAL TUNNEL RELEASE  2016   Dr Ninfa Linden  . Echo/stress test  2014   Normal  . TUBAL LIGATION      Family History  Problem Relation Age of Onset  . Heart disease Mother   . Stroke Mother   . Hypertension Mother   . Hyperlipidemia Mother   . Cancer Sister        breast cancer  . Asthma Father   . Heart disease Brother   . Cancer Paternal Grandmother        colon cancer  . Autoimmune disease Other     Social History   Socioeconomic History  . Marital status: Married    Spouse name: Not on file  . Number of children: 3  . Years of education: Not on file  . Highest education level: Not on file  Occupational History  . Occupation: [employer]    Comment: Retired  Scientific laboratory technician  . Financial resource strain: Not on file  . Food insecurity:    Worry: Not on  file    Inability: Not on file  . Transportation needs:    Medical: Not on file    Non-medical: Not on file  Tobacco Use  . Smoking status: Former Research scientist (life sciences)  . Smokeless tobacco: Never Used  Substance and Sexual Activity  . Alcohol use: Yes    Alcohol/week: 0.0 oz    Comment: rare glass of wine  . Drug use: No  . Sexual activity: Yes  Lifestyle  . Physical activity:    Days per week: Not on file    Minutes per session: Not on file  . Stress: Not on file  Relationships  . Social connections:    Talks on phone: Not on file    Gets together: Not on file    Attends religious service: Not on file    Active member of club or organization: Not on file    Attends meetings of clubs or organizations: Not on file    Relationship status: Not on file  . Intimate partner violence:    Fear of current or ex partner: Not on file    Emotionally abused: Not on file    Physically abused: Not on file    Forced  sexual activity: Not on file  Other Topics Concern  . Not on file  Social History Narrative  . Not on file   Review of Systems  No fever Right hand is swollen some since then Did use sling for a while     Objective:   Physical Exam  Constitutional: No distress.  Musculoskeletal:  No swelling of right shoulder Severe pain with any movement but is able to actively abduct with some help and hold arm over shoulder Pain with any rotation but no crepitus           Assessment & Plan:

## 2019-05-17 ENCOUNTER — Encounter: Payer: Self-pay | Admitting: Internal Medicine

## 2019-05-17 ENCOUNTER — Ambulatory Visit (INDEPENDENT_AMBULATORY_CARE_PROVIDER_SITE_OTHER): Payer: Medicare HMO | Admitting: Internal Medicine

## 2019-05-17 ENCOUNTER — Other Ambulatory Visit: Payer: Self-pay

## 2019-05-17 VITALS — BP 120/84 | HR 80 | Temp 98.5°F | Ht 60.25 in | Wt 148.0 lb

## 2019-05-17 DIAGNOSIS — Z7189 Other specified counseling: Secondary | ICD-10-CM | POA: Diagnosis not present

## 2019-05-17 DIAGNOSIS — Z23 Encounter for immunization: Secondary | ICD-10-CM

## 2019-05-17 DIAGNOSIS — Z1211 Encounter for screening for malignant neoplasm of colon: Secondary | ICD-10-CM

## 2019-05-17 DIAGNOSIS — E7849 Other hyperlipidemia: Secondary | ICD-10-CM

## 2019-05-17 DIAGNOSIS — I10 Essential (primary) hypertension: Secondary | ICD-10-CM

## 2019-05-17 DIAGNOSIS — Z0000 Encounter for general adult medical examination without abnormal findings: Secondary | ICD-10-CM

## 2019-05-17 LAB — COMPREHENSIVE METABOLIC PANEL
ALT: 15 U/L (ref 0–35)
AST: 17 U/L (ref 0–37)
Albumin: 4.5 g/dL (ref 3.5–5.2)
Alkaline Phosphatase: 94 U/L (ref 39–117)
BUN: 14 mg/dL (ref 6–23)
CO2: 25 mEq/L (ref 19–32)
Calcium: 9.5 mg/dL (ref 8.4–10.5)
Chloride: 103 mEq/L (ref 96–112)
Creatinine, Ser: 0.62 mg/dL (ref 0.40–1.20)
GFR: 96.51 mL/min (ref >60.00)
Glucose, Bld: 86 mg/dL (ref 70–99)
Potassium: 4 mEq/L (ref 3.5–5.1)
Sodium: 138 mEq/L (ref 135–145)
Total Bilirubin: 1.2 mg/dL (ref 0.2–1.2)
Total Protein: 7.1 g/dL (ref 6.0–8.3)

## 2019-05-17 LAB — LIPID PANEL
Cholesterol: 275 mg/dL — ABNORMAL HIGH (ref 0–200)
HDL: 65.2 mg/dL (ref >39.00)
LDL Cholesterol: 170 mg/dL — ABNORMAL HIGH (ref 0–99)
NonHDL: 209.43
Total CHOL/HDL Ratio: 4
Triglycerides: 196 mg/dL — ABNORMAL HIGH (ref 0.0–149.0)
VLDL: 39.2 mg/dL (ref 0.0–40.0)

## 2019-05-17 LAB — CBC
HCT: 42.4 % (ref 36.0–46.0)
Hemoglobin: 14.1 g/dL (ref 12.0–15.0)
MCHC: 33.3 g/dL (ref 30.0–36.0)
MCV: 90.9 fl (ref 78.0–100.0)
Platelets: 276 10*3/uL (ref 150.0–400.0)
RBC: 4.67 Mil/uL (ref 3.87–5.11)
RDW: 12.9 % (ref 11.5–15.5)
WBC: 6.6 10*3/uL (ref 4.0–10.5)

## 2019-05-17 NOTE — Addendum Note (Signed)
Addended by: Pilar Grammes on: 05/17/2019 01:59 PM   Modules accepted: Orders

## 2019-05-17 NOTE — Assessment & Plan Note (Addendum)
I have personally reviewed the Medicare Annual Wellness questionnaire and have noted  1. The patient's medical and social history  2. Their use of alcohol, tobacco or illicit drugs  3. Their current medications and supplements  4. The patient's functional ability including ADL's, fall risks, home safety risks and hearing or visual              impairment.  5. Diet and physical activities  6. Evidence for depression or mood disorders  The patients weight, height, BMI and visual acuity have been recorded in the chart  I have made referrals, counseling and provided education to the patient based review of the above and I have provided the pt with a written personalized care plan for preventive services.   I have provided you with a copy of your personalized plan for preventive services. Please take the time to review along with your updated medication list.  Tries to exercise regularly---very muscular so BMI overstates weight. She feels ~130-135# is good weight Will give prevnar Flu vaccine in the fall Overdue---needs to get the mammogram FIT--she forgot last year EKG--sinus @63 . Normal axis and intervals. No ischemic changes or hypertrophy. No comparison available

## 2019-05-17 NOTE — Progress Notes (Signed)
Subjective:    Patient ID: Catherine Dalton, female    DOB: Nov 20, 1955, 63 y.o.   MRN: [id number]  HPI Here for initial Medicare Preventative visit and follow up of chronic health conditions Reviewed form and advanced directives Reviewed other doctors No alcohol or tobacco Stays active walking and gardening. [facility] Vision is fine with glasses Has some hearing loss----left ear especially. Considering hearing aide No falls No depression or anhedonia No hospitalizations or surgery in the past year Independent with instrumental ADLs No obvious memory problems  BP still good--she does monitor this No chest pain or SOB No palpitations No dizziness or syncope No edema  Known elevated cholesterol She still prefers no medication for this  Current Outpatient Medications on File Prior to Visit  Medication Sig Dispense Refill  . aspirin 325 MG tablet Take 325 mg by mouth daily as needed. For pain     No current facility-administered medications on file prior to visit.     No Known Allergies  Past Medical History:  Diagnosis Date  . Allergic rhinitis due to pollen   . Essential hypertension, benign 2010   didn't tolerate meds    Past Surgical History:  Procedure Laterality Date  . ABDOMINAL HYSTERECTOMY     Fibroids  . [place]  . BASAL CELL CARCINOMA EXCISION  2014   Face  . Carotid duplex  02/10/13   Normal  . CARPAL TUNNEL RELEASE  2016   Dr Ninfa Linden  . Echo/stress test  2014   Normal  . TUBAL LIGATION      Family History  Problem Relation Age of Onset  . Heart disease Mother   . Stroke Mother   . Hypertension Mother   . Hyperlipidemia Mother   . Cancer Sister        breast cancer  . Asthma Father   . Heart disease Brother   . Cancer Paternal Grandmother        colon cancer  . Autoimmune disease Other     Social History   Socioeconomic History  . Marital status: Married    Spouse name: Not on file  .  Number of children: 3  . Years of education: Not on file  . Highest education level: Not on file  Occupational History  . Occupation: [employer]    Comment: Retired  Scientific laboratory technician  . Financial resource strain: Not on file  . Food insecurity    Worry: Not on file    Inability: Not on file  . Transportation needs    Medical: Not on file    Non-medical: Not on file  Tobacco Use  . Smoking status: Former Research scientist (life sciences)  . Smokeless tobacco: Never Used  Substance and Sexual Activity  . Alcohol use: Yes    Alcohol/week: 0.0 standard drinks    Comment: rare glass of wine  . Drug use: No  . Sexual activity: Yes  Lifestyle  . Physical activity    Days per week: Not on file    Minutes per session: Not on file  . Stress: Not on file  Relationships  . Social Herbalist on phone: Not on file    Gets together: Not on file    Attends religious service: Not on file    Active member of club or organization: Not on file    Attends meetings of clubs or organizations: Not on file    Relationship status:  Not on file  . Intimate partner violence    Fear of current or ex partner: Not on file    Emotionally abused: Not on file    Physically abused: Not on file    Forced sexual activity: Not on file  Other Topics Concern  . Not on file  Social History Narrative   Has living will   Husband is health care POA. Sister Catherine Dalton is alternate   Would accept resuscitation but no prolonged ventilation   No tube feeds if cognitively unaware   Review of Systems Appetite is fine Weight is down a little--working on it Sleeps fine Teeth are fine now---keeps up with dentist Bowels are fine---no blood Rare heartburn--will use chewable OTC with success Voids fine. Some issues with stress incontinence No suspicious skin lesions No sig joint pain. Some intermittent left low back pain/sciatica. Better with exercises    Objective:   Physical Exam  Constitutional: She is oriented to  person, place, and time. She appears well-developed. No distress.  HENT:  Mouth/Throat: Oropharynx is clear and moist. No oropharyngeal exudate.  Neck: No thyromegaly present.  Cardiovascular: Normal rate, regular rhythm, normal heart sounds and intact distal pulses. Exam reveals no gallop.  No murmur heard. Respiratory: Effort normal and breath sounds normal. No respiratory distress. She has no wheezes. She has no rales.  GI: Soft. There is no abdominal tenderness.  Musculoskeletal:        General: No tenderness or edema.  Lymphadenopathy:    She has no cervical adenopathy.  Neurological: She is alert and oriented to person, place, and time.  President--- "Dwaine Deter, [place]" [phone] [id number] Recall 3/3  Skin: No rash noted. No erythema.  Psychiatric: She has a normal mood and affect. Her behavior is normal.           Assessment & Plan:

## 2019-05-17 NOTE — Patient Instructions (Signed)
Please set up your screening mammogram. Do the fecal test as soon as you get home.

## 2019-05-17 NOTE — Progress Notes (Signed)
Hearing Screening   Method: Audiometry   125Hz  250Hz  500Hz  1000Hz  2000Hz  3000Hz  4000Hz  6000Hz  8000Hz   Right ear:   20 20 20  20     Left ear:   25 0 25  25      Visual Acuity Screening   Right eye Left eye Both eyes  Without correction:     With correction: 20/15 20/15 20/15

## 2019-05-17 NOTE — Assessment & Plan Note (Signed)
Working on lifestyle Still prefers no meds

## 2019-05-17 NOTE — Assessment & Plan Note (Signed)
BP Readings from Last 3 Encounters:  05/17/19 120/84  05/18/18 (!) 142/98  05/13/18 138/90   BP still fine without meds

## 2019-05-17 NOTE — Assessment & Plan Note (Signed)
See social history 

## 2019-05-21 ENCOUNTER — Other Ambulatory Visit (INDEPENDENT_AMBULATORY_CARE_PROVIDER_SITE_OTHER): Payer: Medicare HMO

## 2019-05-21 DIAGNOSIS — Z1211 Encounter for screening for malignant neoplasm of colon: Secondary | ICD-10-CM | POA: Diagnosis not present

## 2019-05-21 LAB — FECAL OCCULT BLOOD, IMMUNOCHEMICAL: Fecal Occult Bld: NEGATIVE

## 2019-06-23 DIAGNOSIS — M79605 Pain in left leg: Secondary | ICD-10-CM | POA: Diagnosis not present

## 2019-07-05 DIAGNOSIS — M25552 Pain in left hip: Secondary | ICD-10-CM | POA: Diagnosis not present

## 2019-07-12 DIAGNOSIS — M79605 Pain in left leg: Secondary | ICD-10-CM | POA: Diagnosis not present

## 2019-07-28 DIAGNOSIS — M25552 Pain in left hip: Secondary | ICD-10-CM | POA: Diagnosis not present

## 2019-08-03 DIAGNOSIS — M25552 Pain in left hip: Secondary | ICD-10-CM | POA: Diagnosis not present

## 2019-08-04 DIAGNOSIS — M79605 Pain in left leg: Secondary | ICD-10-CM | POA: Diagnosis not present

## 2019-08-05 ENCOUNTER — Ambulatory Visit (INDEPENDENT_AMBULATORY_CARE_PROVIDER_SITE_OTHER): Payer: Medicare HMO

## 2019-08-05 DIAGNOSIS — Z23 Encounter for immunization: Secondary | ICD-10-CM

## 2019-09-15 DIAGNOSIS — H524 Presbyopia: Secondary | ICD-10-CM | POA: Diagnosis not present

## 2019-09-15 DIAGNOSIS — Z0100 Encounter for examination of eyes and vision without abnormal findings: Secondary | ICD-10-CM | POA: Diagnosis not present

## 2019-09-16 DIAGNOSIS — Z8249 Family history of ischemic heart disease and other diseases of the circulatory system: Secondary | ICD-10-CM | POA: Diagnosis not present

## 2019-09-16 DIAGNOSIS — R030 Elevated blood-pressure reading, without diagnosis of hypertension: Secondary | ICD-10-CM | POA: Diagnosis not present

## 2019-09-16 DIAGNOSIS — Z809 Family history of malignant neoplasm, unspecified: Secondary | ICD-10-CM | POA: Diagnosis not present

## 2019-09-16 DIAGNOSIS — G8929 Other chronic pain: Secondary | ICD-10-CM | POA: Diagnosis not present

## 2019-09-16 DIAGNOSIS — J302 Other seasonal allergic rhinitis: Secondary | ICD-10-CM | POA: Diagnosis not present

## 2019-09-16 DIAGNOSIS — Z6829 Body mass index (BMI) 29.0-29.9, adult: Secondary | ICD-10-CM | POA: Diagnosis not present

## 2019-09-16 DIAGNOSIS — E663 Overweight: Secondary | ICD-10-CM | POA: Diagnosis not present

## 2019-09-16 DIAGNOSIS — Z823 Family history of stroke: Secondary | ICD-10-CM | POA: Diagnosis not present

## 2019-09-16 DIAGNOSIS — M1990 Unspecified osteoarthritis, unspecified site: Secondary | ICD-10-CM | POA: Diagnosis not present

## 2019-09-16 DIAGNOSIS — Z803 Family history of malignant neoplasm of breast: Secondary | ICD-10-CM | POA: Diagnosis not present

## 2019-09-23 DIAGNOSIS — R69 Illness, unspecified: Secondary | ICD-10-CM | POA: Diagnosis not present

## 2019-11-24 NOTE — Telephone Encounter (Signed)
Please call to see if we can get her in by Monday

## 2019-11-26 ENCOUNTER — Ambulatory Visit (INDEPENDENT_AMBULATORY_CARE_PROVIDER_SITE_OTHER): Payer: Medicare HMO | Admitting: Internal Medicine

## 2019-11-26 ENCOUNTER — Encounter: Payer: Self-pay | Admitting: Internal Medicine

## 2019-11-26 ENCOUNTER — Other Ambulatory Visit: Payer: Self-pay

## 2019-11-26 DIAGNOSIS — M6289 Other specified disorders of muscle: Secondary | ICD-10-CM | POA: Diagnosis not present

## 2019-11-26 NOTE — Assessment & Plan Note (Signed)
Mostly right supraspinatus Not related to the small cyst Discussed heat, massage, regular resistance for upper body Consider chiropractor/inversion table

## 2019-11-26 NOTE — Progress Notes (Signed)
Subjective:    Patient ID: Catherine Dalton, female    DOB: 08-10-1956, 64 y.o.   MRN: [id number]  HPI Here due a skin lesion to be checked This visit occurred during the SARS-CoV-2 public health emergency.  Safety protocols were in place, including screening questions prior to the visit, additional usage of staff PPE, and extensive cleaning of exam room while observing appropriate contact time as indicated for disinfecting solutions.   Has a bump on the top of right shoulder blade Feels like her muscles may be spasming Seems to "go under the shoulder blade" when her husband massages Bump doesn't go away though Has prominence of clavicle on right and pulling when she moves her arm  No neck pain or restriction in ROM  Current Outpatient Medications on File Prior to Visit  Medication Sig Dispense Refill  . aspirin 325 MG tablet Take 325 mg by mouth daily as needed. For pain     No current facility-administered medications on file prior to visit.    No Known Allergies  Past Medical History:  Diagnosis Date  . Allergic rhinitis due to pollen   . Essential hypertension, benign 2010   didn't tolerate meds    Past Surgical History:  Procedure Laterality Date  . ABDOMINAL HYSTERECTOMY     Fibroids  . [place]  . BASAL CELL CARCINOMA EXCISION  2014   Face  . Carotid duplex  02/10/13   Normal  . CARPAL TUNNEL RELEASE  2016   Dr Ninfa Linden  . Echo/stress test  2014   Normal  . TUBAL LIGATION      Family History  Problem Relation Age of Onset  . Heart disease Mother   . Stroke Mother   . Hypertension Mother   . Hyperlipidemia Mother   . Cancer Sister        breast cancer  . Asthma Father   . Heart disease Brother   . Cancer Paternal Grandmother        colon cancer  . Autoimmune disease Other     Social History   Socioeconomic History  . Marital status: Married    Spouse name: Not on file  . Number of children: 3  . Years of education: Not on  file  . Highest education level: Not on file  Occupational History  . Occupation: [employer]    Comment: Retired  Tobacco Use  . Smoking status: Former Research scientist (life sciences)  . Smokeless tobacco: Never Used  Substance and Sexual Activity  . Alcohol use: Yes    Alcohol/week: 0.0 standard drinks    Comment: rare glass of wine  . Drug use: No  . Sexual activity: Yes  Other Topics Concern  . Not on file  Social History Narrative   Has living will   Husband is health care POA. Sister Leonette Monarch is alternate   Would accept resuscitation but no prolonged ventilation   No tube feeds if cognitively unaware   Social Determinants of Health   Financial Resource Strain:   . Difficulty of Paying Living Expenses: Not on file  Food Insecurity:   . Worried About Charity fundraiser in the Last Year: Not on file  . Ran Out of Food in the Last Year: Not on file  Transportation Needs:   . Lack of Transportation (Medical): Not on file  . Lack of Transportation (Non-Medical): Not on file  Physical Activity:   . Days of Exercise per Week: Not on file  .  Minutes of Exercise per Session: Not on file  Stress:   . Feeling of Stress : Not on file  Social Connections:   . Frequency of Communication with Friends and Family: Not on file  . Frequency of Social Gatherings with Friends and Family: Not on file  . Attends Religious Services: Not on file  . Active Member of Clubs or Organizations: Not on file  . Attends Archivist Meetings: Not on file  . Marital Status: Not on file  Intimate Partner Violence:   . Fear of Current or Ex-Partner: Not on file  . Emotionally Abused: Not on file  . Physically Abused: Not on file  . Sexually Abused: Not on file   Review of Systems No arm weakness Gets sciatica at times---will have some low back pain at times    Objective:   Physical Exam  Neck: No thyromegaly present.  3-79mm apparent cyst overlying right trapezius Fairly superficial and not  inflamed (discussed---this is not any problem)  Respiratory:  Prominent sternoclavicular joints (R>L) with prominent clavicular heads  Musculoskeletal:     Cervical back: Normal range of motion and neck supple.     Comments: Tenderness and tightness along suprapinatus on right and lesser in traps ROM fine in neck/shoulders  Lymphadenopathy:    She has no cervical adenopathy.           Assessment & Plan:

## 2020-03-09 DIAGNOSIS — R69 Illness, unspecified: Secondary | ICD-10-CM | POA: Diagnosis not present

## 2020-03-29 ENCOUNTER — Ambulatory Visit (INDEPENDENT_AMBULATORY_CARE_PROVIDER_SITE_OTHER)
Admission: RE | Admit: 2020-03-29 | Discharge: 2020-03-29 | Disposition: A | Discharge status: Home / Self Care | Payer: Medicare HMO | Source: Ambulatory Visit | Attending: Internal Medicine | Admitting: Internal Medicine

## 2020-03-29 ENCOUNTER — Encounter: Payer: Self-pay | Admitting: Internal Medicine

## 2020-03-29 ENCOUNTER — Ambulatory Visit (INDEPENDENT_AMBULATORY_CARE_PROVIDER_SITE_OTHER): Payer: Medicare HMO | Admitting: Internal Medicine

## 2020-03-29 ENCOUNTER — Other Ambulatory Visit: Payer: Self-pay

## 2020-03-29 DIAGNOSIS — S6991XA Unspecified injury of right wrist, hand and finger(s), initial encounter: Secondary | ICD-10-CM | POA: Diagnosis not present

## 2020-03-29 DIAGNOSIS — M545 Low back pain, unspecified: Secondary | ICD-10-CM | POA: Insufficient documentation

## 2020-03-29 DIAGNOSIS — G8929 Other chronic pain: Secondary | ICD-10-CM | POA: Diagnosis not present

## 2020-03-29 DIAGNOSIS — M25531 Pain in right wrist: Secondary | ICD-10-CM | POA: Diagnosis not present

## 2020-03-29 DIAGNOSIS — M79641 Pain in right hand: Secondary | ICD-10-CM | POA: Diagnosis not present

## 2020-03-29 NOTE — Progress Notes (Signed)
Subjective:    Patient ID: Catherine Dalton, female    DOB: 1956/08/15, 64 y.o.   MRN: [id number]  HPI Here due to hand pain This visit occurred during the SARS-CoV-2 public health emergency.  Safety protocols were in place, including screening questions prior to the visit, additional usage of staff PPE, and extensive cleaning of exam room while observing appropriate contact time as indicated for disinfecting solutions.   Was trying to pound some frozen hamburger patties apart with a butter knife on palm On Memorial Day (about 2 weeks ago) Hurt right away---stopped what she was doing and kept up with all activities Pain with grabbing things in certain ways Mostly has to try to do things without using her thumb Pain will shoot down her forearm Takes ASA at times--may help some Current Outpatient Medications on File Prior to Visit  Medication Sig Dispense Refill  . aspirin 325 MG tablet Take 325 mg by mouth daily as needed. For pain     No current facility-administered medications on file prior to visit.    No Known Allergies  Past Medical History:  Diagnosis Date  . Allergic rhinitis due to pollen   . Essential hypertension, benign 2010   didn't tolerate meds    Past Surgical History:  Procedure Laterality Date  . ABDOMINAL HYSTERECTOMY     Fibroids  . [place]  . BASAL CELL CARCINOMA EXCISION  2014   Face  . Carotid duplex  02/10/13   Normal  . CARPAL TUNNEL RELEASE  2016   Dr Ninfa Linden  . Echo/stress test  2014   Normal  . TUBAL LIGATION      Family History  Problem Relation Age of Onset  . Heart disease Mother   . Stroke Mother   . Hypertension Mother   . Hyperlipidemia Mother   . Cancer Sister        breast cancer  . Asthma Father   . Heart disease Brother   . Cancer Paternal Grandmother        colon cancer  . Autoimmune disease Other     Social History   Socioeconomic History  . Marital status: Married    Spouse name: Not on file   . Number of children: 3  . Years of education: Not on file  . Highest education level: Not on file  Occupational History  . Occupation: [employer]    Comment: Retired  Tobacco Use  . Smoking status: Former Research scientist (life sciences)  . Smokeless tobacco: Never Used  Substance and Sexual Activity  . Alcohol use: Yes    Alcohol/week: 0.0 standard drinks    Comment: rare glass of wine  . Drug use: No  . Sexual activity: Yes  Other Topics Concern  . Not on file  Social History Narrative   Has living will   Husband is health care POA. Sister Leonette Monarch is alternate   Would accept resuscitation but no prolonged ventilation   No tube feeds if cognitively unaware   Social Determinants of Health   Financial Resource Strain:   . Difficulty of Paying Living Expenses:   Food Insecurity:   . Worried About Charity fundraiser in the Last Year:   . Arboriculturist in the Last Year:   Transportation Needs:   . Film/video editor (Medical):   Marland Kitchen Lack of Transportation (Non-Medical):   Physical Activity:   . Days of Exercise per Week:   . Minutes of Exercise per  Session:   Stress:   . Feeling of Stress :   Social Connections:   . Frequency of Communication with Friends and Family:   . Frequency of Social Gatherings with Friends and Family:   . Attends Religious Services:   . Active Member of Clubs or Organizations:   . Attends Archivist Meetings:   Marland Kitchen Marital Status:   Intimate Partner Violence:   . Fear of Current or Ex-Partner:   . Emotionally Abused:   Marland Kitchen Physically Abused:   . Sexually Abused:    Review of Systems  No hand weakness Some back pain --?spasm (goes back at least 6 months). She thinks she is tightening muscles to keep from falling. No radiation down legs regularly. No leg weakness     Objective:   Physical Exam  GI: Normal appearance.  Musculoskeletal:     Comments: Tenderness in left lateral lumbar area. No spine tenderness SLR negative Mild  decreased internal rotation in hips but no pain Excellent back flexion  Swelling along right thenar eminence Tenderness by CMC and wrist on right ---especially radial side  Neurological: She is alert.  No leg weakness Normal gait           Assessment & Plan:

## 2020-03-29 NOTE — Assessment & Plan Note (Signed)
Seems to be muscular Discussed mechanics Consider evaluation by sports medicine or physiatry

## 2020-03-29 NOTE — Assessment & Plan Note (Addendum)
After blunt trauma Due to swelling and point tenderness--will check x-ray  X-ray negative Probably just deep bruise Discussed supportive care

## 2020-03-30 DIAGNOSIS — Z809 Family history of malignant neoplasm, unspecified: Secondary | ICD-10-CM | POA: Diagnosis not present

## 2020-03-30 DIAGNOSIS — Z8249 Family history of ischemic heart disease and other diseases of the circulatory system: Secondary | ICD-10-CM | POA: Diagnosis not present

## 2020-03-30 DIAGNOSIS — Z7722 Contact with and (suspected) exposure to environmental tobacco smoke (acute) (chronic): Secondary | ICD-10-CM | POA: Diagnosis not present

## 2020-03-30 DIAGNOSIS — E663 Overweight: Secondary | ICD-10-CM | POA: Diagnosis not present

## 2020-03-30 DIAGNOSIS — R030 Elevated blood-pressure reading, without diagnosis of hypertension: Secondary | ICD-10-CM | POA: Diagnosis not present

## 2020-03-30 DIAGNOSIS — Z87891 Personal history of nicotine dependence: Secondary | ICD-10-CM | POA: Diagnosis not present

## 2020-03-30 DIAGNOSIS — Z833 Family history of diabetes mellitus: Secondary | ICD-10-CM | POA: Diagnosis not present

## 2020-05-02 NOTE — Telephone Encounter (Signed)
I called patient. Patient was busy in her garden and said she'd call back to reschedule her appointment.

## 2020-05-08 DIAGNOSIS — M545 Low back pain: Secondary | ICD-10-CM | POA: Diagnosis not present

## 2020-05-08 DIAGNOSIS — M25531 Pain in right wrist: Secondary | ICD-10-CM | POA: Diagnosis not present

## 2020-05-17 ENCOUNTER — Encounter: Payer: Medicare HMO | Admitting: Internal Medicine

## 2020-06-01 DIAGNOSIS — Z20822 Contact with and (suspected) exposure to covid-19: Secondary | ICD-10-CM | POA: Diagnosis not present

## 2020-06-01 DIAGNOSIS — U071 COVID-19: Secondary | ICD-10-CM | POA: Diagnosis not present

## 2020-06-08 DIAGNOSIS — Z20822 Contact with and (suspected) exposure to covid-19: Secondary | ICD-10-CM | POA: Diagnosis not present

## 2020-06-08 DIAGNOSIS — Z03818 Encounter for observation for suspected exposure to other biological agents ruled out: Secondary | ICD-10-CM | POA: Diagnosis not present

## 2020-07-13 ENCOUNTER — Other Ambulatory Visit: Payer: Self-pay

## 2020-07-13 ENCOUNTER — Ambulatory Visit (INDEPENDENT_AMBULATORY_CARE_PROVIDER_SITE_OTHER): Payer: Medicare HMO

## 2020-07-13 DIAGNOSIS — Z23 Encounter for immunization: Secondary | ICD-10-CM | POA: Diagnosis not present

## 2020-08-07 DIAGNOSIS — Z0100 Encounter for examination of eyes and vision without abnormal findings: Secondary | ICD-10-CM | POA: Diagnosis not present

## 2020-10-01 DIAGNOSIS — Z03818 Encounter for observation for suspected exposure to other biological agents ruled out: Secondary | ICD-10-CM | POA: Diagnosis not present

## 2020-10-01 DIAGNOSIS — Z20822 Contact with and (suspected) exposure to covid-19: Secondary | ICD-10-CM | POA: Diagnosis not present

## 2020-11-14 DIAGNOSIS — Z20822 Contact with and (suspected) exposure to covid-19: Secondary | ICD-10-CM | POA: Diagnosis not present

## 2020-11-14 DIAGNOSIS — Z03818 Encounter for observation for suspected exposure to other biological agents ruled out: Secondary | ICD-10-CM | POA: Diagnosis not present

## 2020-12-12 ENCOUNTER — Ambulatory Visit (INDEPENDENT_AMBULATORY_CARE_PROVIDER_SITE_OTHER): Payer: Medicare HMO | Admitting: Internal Medicine

## 2020-12-12 ENCOUNTER — Encounter: Payer: Self-pay | Admitting: Internal Medicine

## 2020-12-12 ENCOUNTER — Other Ambulatory Visit: Payer: Self-pay

## 2020-12-12 ENCOUNTER — Ambulatory Visit (INDEPENDENT_AMBULATORY_CARE_PROVIDER_SITE_OTHER)
Admission: RE | Admit: 2020-12-12 | Discharge: 2020-12-12 | Disposition: A | Discharge status: Home / Self Care | Payer: Medicare HMO | Source: Ambulatory Visit | Attending: Internal Medicine | Admitting: Internal Medicine

## 2020-12-12 DIAGNOSIS — S6992XA Unspecified injury of left wrist, hand and finger(s), initial encounter: Secondary | ICD-10-CM | POA: Diagnosis not present

## 2020-12-12 DIAGNOSIS — M25532 Pain in left wrist: Secondary | ICD-10-CM | POA: Diagnosis not present

## 2020-12-12 NOTE — Progress Notes (Signed)
Subjective:    Patient ID: Catherine Dalton, female    DOB: 03-02-1956, 65 y.o.   MRN: [id number]  HPI  Here due to left hand pain after a fall This visit occurred during the SARS-CoV-2 public health emergency.  Safety protocols were in place, including screening questions prior to the visit, additional usage of staff PPE, and extensive cleaning of exam room while observing appropriate contact time as indicated for disinfecting solutions.   Working on a deck Was going into shed --on ramp yesterday Slipped on algae on the wood--and both feet came right out on her [address] right on back--both hands went down. Hit left hand on deck Did go back to work---then later she took gloves off and noted swelling  Did take 2 aspirin Better than yesterday No ice--just kept working  Current Outpatient Medications on File Prior to Visit  Medication Sig Dispense Refill  . aspirin 325 MG tablet Take 325 mg by mouth daily as needed. For pain     No current facility-administered medications on file prior to visit.    No Known Allergies  Past Medical History:  Diagnosis Date  . Allergic rhinitis due to pollen   . Essential hypertension, benign 2010   didn't tolerate meds    Past Surgical History:  Procedure Laterality Date  . ABDOMINAL HYSTERECTOMY     Fibroids  . [place]  . BASAL CELL CARCINOMA EXCISION  2014   Face  . Carotid duplex  02/10/13   Normal  . CARPAL TUNNEL RELEASE  2016   Dr Ninfa Linden  . Echo/stress test  2014   Normal  . TUBAL LIGATION      Family History  Problem Relation Age of Onset  . Heart disease Mother   . Stroke Mother   . Hypertension Mother   . Hyperlipidemia Mother   . Cancer Sister        breast cancer  . Asthma Father   . Heart disease Brother   . Cancer Paternal Grandmother        colon cancer  . Autoimmune disease Other     Social History   Socioeconomic History  . Marital status: Married    Spouse name: Not on file  .  Number of children: 3  . Years of education: Not on file  . Highest education level: Not on file  Occupational History  . Occupation: [employer]    Comment: Retired  Tobacco Use  . Smoking status: Former Research scientist (life sciences)  . Smokeless tobacco: Never Used  Substance and Sexual Activity  . Alcohol use: Yes    Alcohol/week: 0.0 standard drinks    Comment: rare glass of wine  . Drug use: No  . Sexual activity: Yes  Other Topics Concern  . Not on file  Social History Narrative   Has living will   Husband is health care POA. Sister Leonette Monarch is alternate   Would accept resuscitation but no prolonged ventilation   No tube feeds if cognitively unaware   Social Determinants of Health   Financial Resource Strain: Not on file  Food Insecurity: Not on file  Transportation Needs: Not on file  Physical Activity: Not on file  Stress: Not on file  Social Connections: Not on file  Intimate Partner Violence: Not on file   Review of Systems Still using hand    Objective:   Physical Exam Constitutional:      Appearance: Normal appearance.  Musculoskeletal:     Comments:  Some bruising along volar left wrist Pain with passive ROM Fingers and metacarpals are not tender  Neurological:     Mental Status: She is alert.            Assessment & Plan:

## 2020-12-12 NOTE — Assessment & Plan Note (Addendum)
Hard direct trauma onto wooden deck Kept working but swelling and tenderness at that spot Will check x-ray --but findings don't make fracture likely  The x-ray is negative Discussed catering activity to keep pain down Can consider OTC NSAID if pain is bad

## 2021-01-01 DIAGNOSIS — M25562 Pain in left knee: Secondary | ICD-10-CM | POA: Diagnosis not present

## 2021-04-23 ENCOUNTER — Ambulatory Visit (INDEPENDENT_AMBULATORY_CARE_PROVIDER_SITE_OTHER): Payer: Medicare HMO | Admitting: Physician Assistant

## 2021-04-23 ENCOUNTER — Other Ambulatory Visit: Payer: Self-pay

## 2021-04-23 ENCOUNTER — Encounter: Payer: Self-pay | Admitting: Physician Assistant

## 2021-04-23 DIAGNOSIS — M79642 Pain in left hand: Secondary | ICD-10-CM

## 2021-04-23 NOTE — Progress Notes (Signed)
Office Visit Note   Patient: Catherine Dalton           Date of Birth: 1956/07/11           MRN: [id number] Visit Date: 04/23/2021              Requested by: Venia Carbon, MD [place],  [place] [id number] PCP: Venia Carbon, MD   Assessment & Plan: Visit Diagnoses:  1. Pain of left hand     Plan: Given patient's continued pain now for over 6 months recommend MRI of the left wrist to rule out occult fracture involving the carpal bones particularly of the lunate.  Have her follow-up once MRI is performed.  Questions were encouraged and answered at length.  Follow-Up Instructions: Return After MRI.   Orders:  No orders of the defined types were placed in this encounter.  No orders of the defined types were placed in this encounter.     Procedures: No procedures performed   Clinical Data: No additional findings.   Subjective: Chief Complaint  Patient presents with   Left Wrist - Pain    HPI Catherine Dalton is a 65 year old female we have not seen in years.  She comes in today with left hand pain.  She states she has deep left hand pain since a fall on the ice in February of this year.  She states she had some bruising at that time.  She states pain comes and goes.  She states whenever she is grasping something and then supinating her hand she has pain in the palm of her hand.  She also has some pain when pushing up on her hand.  She is right-hand dominant.  She does have some tingling and swelling in the left hand after activities.  History of carpal tunnel release bilaterally.  Rates her pain 6 out of 10 pain at worst. Radiographs left wrist 3 views dated 12/12/2020 were reviewed.  No acute fractures no subluxations.  Lunate with some erosive changes proximal ulnar border which were unchanged from prior films in 2018.  Otherwise no bony abnormalities.  Review of Systems See HPI  Objective: Vital Signs: There were no vitals taken for this  visit.  Physical Exam Constitutional:      Appearance: She is not ill-appearing or diaphoretic.  Pulmonary:     Effort: Pulmonary effort is normal.  Neurological:     Mental Status: She is alert and oriented to person, place, and time.  Psychiatric:        Mood and Affect: Mood normal.    Ortho Exam Bilateral hands no rashes skin wounds or ulcerations.  Well-healed surgical scars from carpal tunnel release and left hand trigger finger release.  She is nontender throughout the left hand and wrist.  Ulnar deviation radial deviation of the wrist causes no pain.  She has good dorsiflexion and volar flexion of the left wrist without significant pain.  Radial pulse 2+.  Sensation grossly intact throughout both hands.  Full motor bilateral hands. Specialty Comments:  No specialty comments available.  Imaging: No results found.   PMFS History: Patient Active Problem List   Diagnosis Date Noted   Left wrist pain 12/12/2020   Hand pain, right 03/29/2020   Low back pain without sciatica 03/29/2020   Muscle tightness 11/26/2019   Advance directive discussed with patient 05/17/2019   Hyperlipemia 01/01/2017   Preventative health care 08/02/2015   Essential hypertension, benign 08/02/2015   Past  Medical History:  Diagnosis Date   Allergic rhinitis due to pollen    Essential hypertension, benign 2010   didn't tolerate meds    Family History  Problem Relation Age of Onset   Heart disease Mother    Stroke Mother    Hypertension Mother    Hyperlipidemia Mother    Cancer Sister        breast cancer   Asthma Father    Heart disease Brother    Cancer Paternal Grandmother        colon cancer   Autoimmune disease Other     Past Surgical History:  Procedure Laterality Date   ABDOMINAL HYSTERECTOMY     Fibroids   ANKLE FRACTURE SURGERY  1987   BASAL CELL CARCINOMA EXCISION  2014   Face   Carotid duplex  02/10/13   Normal   CARPAL TUNNEL RELEASE  2016   Dr Rona Ravens  test  2014   Normal   TUBAL LIGATION     Social History   Occupational History   Occupation: [employer]    Comment: Retired  Tobacco Use   Smoking status: Former    Pack years: 0.00   Smokeless tobacco: Never  Substance and Sexual Activity   Alcohol use: Yes    Alcohol/week: 0.0 standard drinks    Comment: rare glass of wine   Drug use: No   Sexual activity: Yes

## 2021-04-23 NOTE — Addendum Note (Signed)
Addended by: Robyne Peers on: 04/23/2021 04:22 PM   Modules accepted: Orders

## 2021-05-04 ENCOUNTER — Other Ambulatory Visit: Payer: Self-pay

## 2021-05-04 ENCOUNTER — Ambulatory Visit
Admission: RE | Admit: 2021-05-04 | Discharge: 2021-05-04 | Disposition: A | Discharge status: Home / Self Care | Payer: Medicare HMO | Source: Ambulatory Visit | Attending: Physician Assistant | Admitting: Physician Assistant

## 2021-05-04 DIAGNOSIS — M25432 Effusion, left wrist: Secondary | ICD-10-CM | POA: Diagnosis not present

## 2021-05-04 DIAGNOSIS — M79642 Pain in left hand: Secondary | ICD-10-CM

## 2021-05-04 DIAGNOSIS — M65842 Other synovitis and tenosynovitis, left hand: Secondary | ICD-10-CM | POA: Diagnosis not present

## 2021-05-04 DIAGNOSIS — R600 Localized edema: Secondary | ICD-10-CM | POA: Diagnosis not present

## 2021-05-15 ENCOUNTER — Other Ambulatory Visit: Payer: Self-pay

## 2021-05-15 DIAGNOSIS — M79642 Pain in left hand: Secondary | ICD-10-CM

## 2021-05-16 ENCOUNTER — Ambulatory Visit: Payer: Medicare HMO | Admitting: Physician Assistant

## 2021-05-31 DIAGNOSIS — S63592A Other specified sprain of left wrist, initial encounter: Secondary | ICD-10-CM | POA: Diagnosis not present

## 2021-05-31 DIAGNOSIS — M25532 Pain in left wrist: Secondary | ICD-10-CM | POA: Diagnosis not present

## 2021-06-07 DIAGNOSIS — Z20828 Contact with and (suspected) exposure to other viral communicable diseases: Secondary | ICD-10-CM | POA: Diagnosis not present

## 2021-06-16 ENCOUNTER — Ambulatory Visit (INDEPENDENT_AMBULATORY_CARE_PROVIDER_SITE_OTHER): Payer: Medicare HMO

## 2021-06-16 DIAGNOSIS — Z0000 Encounter for general adult medical examination without abnormal findings: Secondary | ICD-10-CM

## 2021-06-16 NOTE — Patient Instructions (Signed)
Health Maintenance, Female Adopting a healthy lifestyle and getting preventive care are important in promoting health and wellness. Ask your health care provider about: The right schedule for you to have regular tests and exams. Things you can do on your own to prevent diseases and keep yourself healthy. What should I know about diet, weight, and exercise? Eat a healthy diet  Eat a diet that includes plenty of vegetables, fruits, low-fat dairy products, and lean protein. Do not eat a lot of foods that are high in solid fats, added sugars, or sodium. Maintain a healthy weight Body mass index (BMI) is used to identify weight problems. It estimates body fat based on height and weight. Your health care provider can help determine your BMI and help you achieve or maintain a healthy weight. Get regular exercise Get regular exercise. This is one of the most important things you can do for your health. Most adults should: Exercise for at least 150 minutes each week. The exercise should increase your heart rate and make you sweat (moderate-intensity exercise). Do strengthening exercises at least twice a week. This is in addition to the moderate-intensity exercise. Spend less time sitting. Even light physical activity can be beneficial. Watch cholesterol and blood lipids Have your blood tested for lipids and cholesterol at 67 years of age, then have this test every 5 years. Have your cholesterol levels checked more often if: Your lipid or cholesterol levels are high. You are older than 67 years of age. You are at high risk for heart disease. What should I know about cancer screening? Depending on your health history and family history, you may need to have cancer screening at various ages. This may include screening for: Breast cancer. Cervical cancer. Colorectal cancer. Skin cancer. Lung cancer. What should I know about heart disease, diabetes, and high blood pressure? Blood pressure and heart  disease High blood pressure causes heart disease and increases the risk of stroke. This is more likely to develop in people who have high blood pressure readings, are of African descent, or are overweight. Have your blood pressure checked: Every 3-5 years if you are 18-39 years of age. Every year if you are 40 years old or older. Diabetes Have regular diabetes screenings. This checks your fasting blood sugar level. Have the screening done: Once every three years after age 40 if you are at a normal weight and have a low risk for diabetes. More often and at a younger age if you are overweight or have a high risk for diabetes. What should I know about preventing infection? Hepatitis B If you have a higher risk for hepatitis B, you should be screened for this virus. Talk with your health care provider to find out if you are at risk for hepatitis B infection. Hepatitis C Testing is recommended for: Everyone born from 1945 through 1965. Anyone with known risk factors for hepatitis C. Sexually transmitted infections (STIs) Get screened for STIs, including gonorrhea and chlamydia, if: You are sexually active and are younger than 67 years of age. You are older than 67 years of age and your health care provider tells you that you are at risk for this type of infection. Your sexual activity has changed since you were last screened, and you are at increased risk for chlamydia or gonorrhea. Ask your health care provider if you are at risk. Ask your health care provider about whether you are at high risk for HIV. Your health care provider may recommend a prescription medicine   to help prevent HIV infection. If you choose to take medicine to prevent HIV, you should first get tested for HIV. You should then be tested every 3 months for as long as you are taking the medicine. Pregnancy If you are about to stop having your period (premenopausal) and you may become pregnant, seek counseling before you get  pregnant. Take 400 to 800 micrograms (mcg) of folic acid every day if you become pregnant. Ask for birth control (contraception) if you want to prevent pregnancy. Osteoporosis and menopause Osteoporosis is a disease in which the bones lose minerals and strength with aging. This can result in bone fractures. If you are 65 years old or older, or if you are at risk for osteoporosis and fractures, ask your health care provider if you should: Be screened for bone loss. Take a calcium or vitamin D supplement to lower your risk of fractures. Be given hormone replacement therapy (HRT) to treat symptoms of menopause. Follow these instructions at home: Lifestyle Do not use any products that contain nicotine or tobacco, such as cigarettes, e-cigarettes, and chewing tobacco. If you need help quitting, ask your health care provider. Do not use street drugs. Do not share needles. Ask your health care provider for help if you need support or information about quitting drugs. Alcohol use Do not drink alcohol if: Your health care provider tells you not to drink. You are pregnant, may be pregnant, or are planning to become pregnant. If you drink alcohol: Limit how much you use to 0-1 drink a day. Limit intake if you are breastfeeding. Be aware of how much alcohol is in your drink. In the [place] one drink equals one 12 oz bottle of beer (355 mL), one 5 oz glass of wine (148 mL), or one 1 oz glass of hard liquor (44 mL). General instructions Schedule regular health, dental, and eye exams. Stay current with your vaccines. Tell your health care provider if: You often feel depressed. You have ever been abused or do not feel safe at home. Summary Adopting a healthy lifestyle and getting preventive care are important in promoting health and wellness. Follow your health care provider's instructions about healthy diet, exercising, and getting tested or screened for diseases. Follow your health care provider's  instructions on monitoring your cholesterol and blood pressure. This information is not intended to replace advice given to you by your health care provider. Make sure you discuss any questions you have with your health care provider. Document Revised: 12/08/2020 Document Reviewed: 09/23/2018 Elsevier Patient Education  2022 Elsevier Inc.  

## 2021-06-16 NOTE — Telephone Encounter (Signed)
Patient and her husband were scheduled for AWV phone calls today.  This was scheduled through AWV program.   I will let those in charge of this know that Dr Silvio Pate does these himself and that patient need to be told and verified that this is a phone call not an in person visit as the automatic system says unfortunately misleading.  I have not replied to the patient. Sending to PCP and Manager.

## 2021-06-16 NOTE — Progress Notes (Signed)
Subjective:   Catherine Dalton is a 65 y.o. female who presents for Medicare Annual (Subsequent) preventive examination. I connected with  Genene Churn on 06/16/21 by a Audio enabled telemedicine application and verified that I am speaking with the correct person using two identifiers.   I discussed the limitations of evaluation and management by telemedicine. The patient expressed understanding and agreed to proceed. Location of patient: Home Location of provider: Office  Persons participating in the visit: Catherine Dalton (patient) Beckie Salts, RMA   Review of Systems    Defer to PCP Cardiac Risk Factors include: none     Objective:    There were no vitals filed for this visit. There is no height or weight on file to calculate BMI.  No flowsheet data found.  Current Medications (verified) Outpatient Encounter Medications as of 06/16/2021  Medication Sig   aspirin 325 MG tablet Take 325 mg by mouth daily as needed. For pain   No facility-administered encounter medications on file as of 06/16/2021.    Allergies (verified) Patient has no known allergies.   History: Past Medical History:  Diagnosis Date   Allergic rhinitis due to pollen    Essential hypertension, benign 2010   didn't tolerate meds   Past Surgical History:  Procedure Laterality Date   ABDOMINAL HYSTERECTOMY     Fibroids   ANKLE FRACTURE SURGERY  1987   BASAL CELL CARCINOMA EXCISION  2014   Face   Carotid duplex  02/10/13   Normal   CARPAL TUNNEL RELEASE  2016   Dr Rona Ravens test  2014   Normal   TUBAL LIGATION     Family History  Problem Relation Age of Onset   Heart disease Mother    Stroke Mother    Hypertension Mother    Hyperlipidemia Mother    Cancer Sister        breast cancer   Asthma Father    Heart disease Brother    Cancer Paternal Grandmother        colon cancer   Autoimmune disease Other    Social History   Socioeconomic History   Marital status: Married    Spouse  name: Not on file   Number of children: 3   Years of education: Not on file   Highest education level: Not on file  Occupational History   Occupation: [employer]    Comment: Retired  Tobacco Use   Smoking status: Former   Smokeless tobacco: Never  Substance and Sexual Activity   Alcohol use: Yes    Alcohol/week: 0.0 standard drinks    Comment: rare glass of wine   Drug use: No   Sexual activity: Yes  Other Topics Concern   Not on file  Social History Narrative   Has living will   Husband is health care POA. Sister Leonette Monarch is alternate   Would accept resuscitation but no prolonged ventilation   No tube feeds if cognitively unaware   Social Determinants of Health   Financial Resource Strain: Medium Risk   Difficulty of Paying Living Expenses: Somewhat hard  Food Insecurity: No Food Insecurity   Worried About Charity fundraiser in the Last Year: Never true   Ran Out of Food in the Last Year: Never true  Transportation Needs: No Transportation Needs   Lack of Transportation (Medical): No   Lack of Transportation (Non-Medical): No  Physical Activity: Insufficiently Active   Days of Exercise per Week: 3 days  Minutes of Exercise per Session: 20 min  Stress: No Stress Concern Present   Feeling of Stress : Not at all  Social Connections: Socially Integrated   Frequency of Communication with Friends and Family: More than three times a week   Frequency of Social Gatherings with Friends and Family: Three times a week   Attends Religious Services: More than 4 times per year   Active Member of Clubs or Organizations: Yes   Attends Music therapist: More than 4 times per year   Marital Status: Married    Tobacco Counseling Counseling given: Not Answered   Clinical Intake:  Pre-visit preparation completed: Yes  Pain : No/denies pain     Nutritional Status: BMI of 19-24  Normal Nutritional Risks: None Diabetes: No  How often do you need to  have someone help you when you read instructions, pamphlets, or other written materials from your doctor or pharmacy?: 1 - Never What is the last grade level you completed in school?: High School gradurate  Diabetic?No  Interpreter Needed?: No  Information entered by :: Beckie Salts, RMA   Activities of Daily Living In your present state of health, do you have any difficulty performing the following activities: 06/16/2021  Hearing? Y  Vision? Y  Difficulty concentrating or making decisions? N  Walking or climbing stairs? N  Dressing or bathing? N  Doing errands, shopping? N  Preparing Food and eating ? Y  Using the Toilet? Y  In the past six months, have you accidently leaked urine? N  Do you have problems with loss of bowel control? N  Managing your Medications? Y  Managing your Finances? Y  Housekeeping or managing your Housekeeping? Y  Some recent data might be hidden    Patient Care Team: Venia Carbon, MD as PCP - General (Internal Medicine)  Indicate any recent Medical Services you may have received from other than [facility] providers in the past year (date may be approximate).     Assessment:   This is a routine wellness examination for Catherine Dalton.  Hearing/Vision screen No results found.  Dietary issues and exercise activities discussed: Current Exercise Habits: Home exercise routine, Type of exercise: walking, Time (Minutes): 20, Frequency (Times/Week): 4, Weekly Exercise (Minutes/Week): 80, Intensity: Moderate, Exercise limited by: None identified   Goals Addressed   None    Depression Screen PHQ 2/9 Scores 06/16/2021 05/17/2019 05/13/2018 06/24/2015  PHQ - 2 Score 0 0 0 0    Fall Risk Fall Risk  06/16/2021 05/17/2019  Falls in the past year? 1 0  Number falls in past yr: 1 -  Injury with Fall? 0 -  Risk for fall due to : Impaired balance/gait -  Follow up Falls evaluation completed -    FALL RISK PREVENTION PERTAINING TO THE HOME:  Any stairs in or around the  home? No  If so, are there any without handrails? Yes  Home free of loose throw rugs in walkways, pet beds, electrical cords, etc? No  Adequate lighting in your home to reduce risk of falls? Yes   ASSISTIVE DEVICES UTILIZED TO PREVENT FALLS:  Life alert? No  Use of a cane, walker or w/c? No  Grab bars in the bathroom? Yes  Shower chair or bench in shower? Yes  Elevated toilet seat or a handicapped toilet? Yes   TIMED UP AND GO:  Was the test performed? No .  Length of time to ambulate 10 feet: N/A sec.     Cognitive Function:  6CIT Screen 06/16/2021  What Year? 0 points  What month? 0 points  What time? 0 points  Count back from 20 0 points  Months in reverse 0 points  Repeat phrase 0 points  Total Score 0    Immunizations Immunization History  Administered Date(s) Administered   Influenza Inj Mdck Quad Pf 07/31/2017   Influenza, Seasonal, Injecte, Preservative Fre 07/13/2016   Influenza,inj,Quad PF,6+ Mos 07/26/2018, 08/05/2019, 07/13/2020   Influenza-Unspecified 07/25/2015   PFIZER(Purple Top)SARS-COV-2 Vaccination 01/10/2020, 01/31/2020   Pneumococcal Conjugate-13 05/17/2019   Tdap 08/02/2015    TDAP status: Up to date  Flu Vaccine status: Due, Education has been provided regarding the importance of this vaccine. Advised may receive this vaccine at local pharmacy or Health Dept. Aware to provide a copy of the vaccination record if obtained from local pharmacy or Health Dept. Verbalized acceptance and understanding.  Pneumococcal vaccine status: Due, Education has been provided regarding the importance of this vaccine. Advised may receive this vaccine at local pharmacy or Health Dept. Aware to provide a copy of the vaccination record if obtained from local pharmacy or Health Dept. Verbalized acceptance and understanding.  Covid-19 vaccine status: Information provided on how to obtain vaccines.   Qualifies for Shingles Vaccine? Yes   Zostavax completed  N/A    Shingrix Completed?: No.    Education has been provided regarding the importance of this vaccine. Patient has been advised to call insurance company to determine out of pocket expense if they have not yet received this vaccine. Advised may also receive vaccine at local pharmacy or Health Dept. Verbalized acceptance and understanding.  Screening Tests Health Maintenance  Topic Date Due   Hepatitis C Screening  Never done   COLONOSCOPY (Pts 45-23yr Insurance coverage will need to be confirmed)  Never done   Zoster Vaccines- Shingrix (1 of 2) Never done   MAMMOGRAM  02/11/2015   DEXA SCAN  Never done   PNA vac Low Risk Adult (2 of 2 - PPSV23) 05/16/2020   COVID-19 Vaccine (3 - Booster for Pfizer series) 07/02/2020   INFLUENZA VACCINE  05/14/2021   TETANUS/TDAP  08/01/2025   HPV VACCINES  Aged Out    Health Maintenance  Health Maintenance Due  Topic Date Due   Hepatitis C Screening  Never done   COLONOSCOPY (Pts 45-445yrInsurance coverage will need to be confirmed)  Never done   Zoster Vaccines- Shingrix (1 of 2) Never done   MAMMOGRAM  02/11/2015   DEXA SCAN  Never done   PNA vac Low Risk Adult (2 of 2 - PPSV23) 05/16/2020   COVID-19 Vaccine (3 - Booster for Pfizer series) 07/02/2020   INFLUENZA VACCINE  05/14/2021    Colorectal cancer screening: Referral to GI placed N/A. Pt aware the office will call re: appt. Patient prefer PCP place referral  Mammogram status: Ordered N/A. Pt provided with contact info and advised to call to schedule appt. Patient prefer PCP place referral  Bone Density status: Ordered N/A. Pt provided with contact info and advised to call to schedule appt.Patient prefer PCP place referral  Lung Cancer Screening: (Low Dose CT Chest recommended if Age 67-80ears, 30 pack-year currently smoking OR have quit w/in 15years.) does not qualify.   Lung Cancer Screening Referral: NO  Additional Screening:  Hepatitis C Screening: does qualify; Not  Completed  Vision Screening: Recommended annual ophthalmology exams for early detection of glaucoma and other disorders of the eye. Is the patient up to date with their annual eye  exam?  Yes  Who is the provider or what is the name of the office in which the patient attends annual eye exams? 1 year ago If pt is not established with a provider, would they like to be referred to a provider to establish care? No .   Dental Screening: Recommended annual dental exams for proper oral hygiene  Community Resource Referral / Chronic Care Management: CRR required this visit?  No   CCM required this visit?  No      Plan:     I have personally reviewed and noted the following in the patient's chart:   Medical and social history Use of alcohol, tobacco or illicit drugs  Current medications and supplements including opioid prescriptions.  Functional ability and status Nutritional status Physical activity Advanced directives List of other physicians Hospitalizations, surgeries, and ER visits in previous 12 months Vitals Screenings to include cognitive, depression, and falls Referrals and appointments  In addition, I have reviewed and discussed with patient certain preventive protocols, quality metrics, and best practice recommendations. A written personalized care plan for preventive services as well as general preventive health recommendations were provided to patient.     Beckie Salts, [place]   06/16/2021   Nurse Notes: Non face to face 60 minute visit.  Catherine Dalton , Thank you for taking time to come for your Medicare Wellness Visit. I appreciate your ongoing commitment to your health goals. Please review the following plan we discussed and let me know if I can assist you in the future.   These are the goals we discussed:  Goals   None     This is a list of the screening recommended for you and due dates:  Health Maintenance  Topic Date Due   Hepatitis C Screening: USPSTF  Recommendation to screen - Ages 55-79 yo.  Never done   Colon Cancer Screening  Never done   Zoster (Shingles) Vaccine (1 of 2) Never done   Mammogram  02/11/2015   DEXA scan (bone density measurement)  Never done   Pneumonia vaccines (2 of 2 - PPSV23) 05/16/2020   COVID-19 Vaccine (3 - Booster for Pfizer series) 07/02/2020   Flu Shot  05/14/2021   Tetanus Vaccine  08/01/2025   HPV Vaccine  Aged Out

## 2021-06-19 NOTE — Telephone Encounter (Signed)
This access nurse note did not come thru portal and sending copy to be scanned in pts chart and putting a copy on Mandy's desk and sending note to PCP, Leafy Ro and [employer] RNs.

## 2021-06-26 NOTE — Telephone Encounter (Signed)
Contacted pt and apologized for this occurring and thanked her for bringing this to our attention so it could be taken care of. Pt very appreciative and verbalized understanding.

## 2021-07-05 ENCOUNTER — Ambulatory Visit (INDEPENDENT_AMBULATORY_CARE_PROVIDER_SITE_OTHER): Payer: Medicare HMO | Admitting: Internal Medicine

## 2021-07-05 ENCOUNTER — Other Ambulatory Visit: Payer: Self-pay

## 2021-07-05 ENCOUNTER — Encounter: Payer: Self-pay | Admitting: Internal Medicine

## 2021-07-05 VITALS — BP 148/96 | HR 84 | Temp 97.2°F | Ht 60.5 in | Wt 150.0 lb

## 2021-07-05 DIAGNOSIS — Z23 Encounter for immunization: Secondary | ICD-10-CM | POA: Diagnosis not present

## 2021-07-05 DIAGNOSIS — M79641 Pain in right hand: Secondary | ICD-10-CM

## 2021-07-05 NOTE — Progress Notes (Signed)
Subjective:    Patient ID: Catherine Dalton, female    DOB: [birth year], 65 y.o.   MRN: [id number]  HPI Here due to hand pain This visit occurred during the SARS-CoV-2 public health emergency.  Safety protocols were in place, including screening questions prior to the visit, additional usage of staff PPE, and extensive cleaning of exam room while observing appropriate contact time as indicated for disinfecting solutions.   Still having left hand pain Wound up with MRI Has cartilage damage---and might need surgery  Going out of the country--plans to probably have the surgery when she gets back ([place]then [place])  Dealing with the pain Will occasionally take ASA--not regular  Worried that something might get worse--like with her travel  Current Outpatient Medications on File Prior to Visit  Medication Sig Dispense Refill   aspirin 325 MG tablet Take 325 mg by mouth daily as needed. For pain     No current facility-administered medications on file prior to visit.    No Known Allergies  Past Medical History:  Diagnosis Date   Allergic rhinitis due to pollen    Essential hypertension, benign 2010   didn't tolerate meds    Past Surgical History:  Procedure Laterality Date   ABDOMINAL HYSTERECTOMY     Fibroids   ANKLE FRACTURE SURGERY  1987   BASAL CELL CARCINOMA EXCISION  2014   Face   Carotid duplex  02/10/13   Normal   CARPAL TUNNEL RELEASE  2016   Dr Rona Ravens test  2014   Normal   TUBAL LIGATION      Family History  Problem Relation Age of Onset   Heart disease Mother    Stroke Mother    Hypertension Mother    Hyperlipidemia Mother    Cancer Sister        breast cancer   Asthma Father    Heart disease Brother    Cancer Paternal Grandmother        colon cancer   Autoimmune disease Other     Social History   Socioeconomic History   Marital status: Married    Spouse name: Not on file   Number of children: 3   Years of education: Not  on file   Highest education level: Not on file  Occupational History   Occupation: [employer]    Comment: Retired  Tobacco Use   Smoking status: Former   Smokeless tobacco: Never  Substance and Sexual Activity   Alcohol use: Yes    Alcohol/week: 0.0 standard drinks    Comment: rare glass of wine   Drug use: No   Sexual activity: Yes  Other Topics Concern   Not on file  Social History Narrative   Has living will   Husband is health care POA. Sister Leonette Monarch is alternate   Would accept resuscitation but no prolonged ventilation   No tube feeds if cognitively unaware   Social Determinants of Health   Financial Resource Strain: Medium Risk   Difficulty of Paying Living Expenses: Somewhat hard  Food Insecurity: No Food Insecurity   Worried About Charity fundraiser in the Last Year: Never true   Ran Out of Food in the Last Year: Never true  Transportation Needs: No Transportation Needs   Lack of Transportation (Medical): No   Lack of Transportation (Non-Medical): No  Physical Activity: Insufficiently Active   Days of Exercise per Week: 3 days   Minutes of Exercise per Session: 20 min  Stress: No Stress Concern Present   Feeling of Stress : Not at all  Social Connections: Socially Integrated   Frequency of Communication with Friends and Family: More than three times a week   Frequency of Social Gatherings with Friends and Family: Three times a week   Attends Religious Services: More than 4 times per year   Active Member of Clubs or Organizations: Yes   Attends Music therapist: More than 4 times per year   Marital Status: Married  Human resources officer Violence: Not At Risk   Fear of Current or Ex-Partner: No   Emotionally Abused: No   Physically Abused: No   Sexually Abused: No   Review of Systems     Objective:   Physical Exam Constitutional:      Appearance: Normal appearance.  Musculoskeletal:     Comments: Mild tenderness along thenar  eminence and towards ulnar side ROM normal at wrist  Neurological:     Mental Status: She is alert.     Comments: Normal grip strength           Assessment & Plan:

## 2021-07-05 NOTE — Assessment & Plan Note (Signed)
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

## 2021-09-18 ENCOUNTER — Encounter: Payer: Self-pay | Admitting: Internal Medicine

## 2021-09-18 MED ORDER — PREDNISONE 20 MG PO TABS: 40.0000 mg | ORAL_TABLET | Freq: Every day | ORAL | 0 refills | Status: DC

## 2021-09-28 ENCOUNTER — Encounter: Payer: Self-pay | Admitting: Internal Medicine

## 2021-11-29 DIAGNOSIS — S63592A Other specified sprain of left wrist, initial encounter: Secondary | ICD-10-CM | POA: Diagnosis not present

## 2021-11-29 DIAGNOSIS — M25532 Pain in left wrist: Secondary | ICD-10-CM | POA: Diagnosis not present

## 2021-12-07 DIAGNOSIS — M24132 Other articular cartilage disorders, left wrist: Secondary | ICD-10-CM | POA: Diagnosis not present

## 2021-12-07 DIAGNOSIS — G8918 Other acute postprocedural pain: Secondary | ICD-10-CM | POA: Diagnosis not present

## 2021-12-11 DIAGNOSIS — S63592A Other specified sprain of left wrist, initial encounter: Secondary | ICD-10-CM | POA: Diagnosis not present

## 2021-12-11 DIAGNOSIS — M25532 Pain in left wrist: Secondary | ICD-10-CM | POA: Diagnosis not present

## 2021-12-26 IMAGING — MR MR WRIST*L* W/O CM
6 series · 40 of 40 positions shown · non-contrast
Comparison: Radiograph 12/12/2020

CLINICAL DATA: Wrist pain, no prior imaging left wrist pain

EXAM:
MR OF THE LEFT WRIST WITHOUT CONTRAST
TECHNIQUE: Multiplanar, multisequence MR imaging of the left wrist was
performed. No intravenous contrast was administered.

[Series 5: T2 fat-sat · oblique · left · 3.0mm · 0.34mm/px · 9 of 25 slices shown (1 of 2)]
[im 1/25]
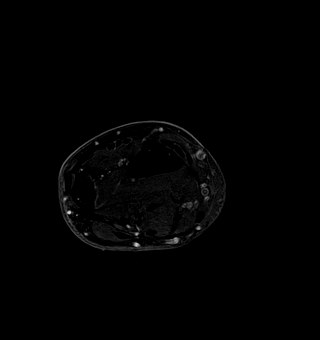
[im 4/25]
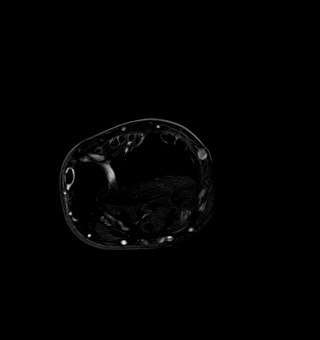
[im 7/25]
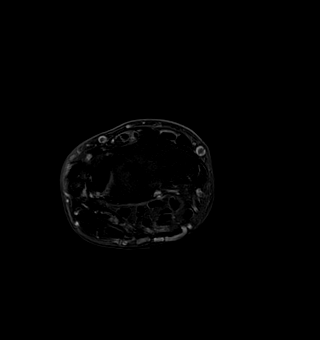
[im 10/25]
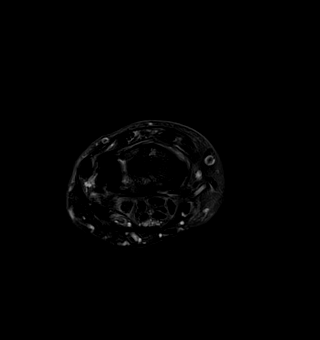
[im 13/25]
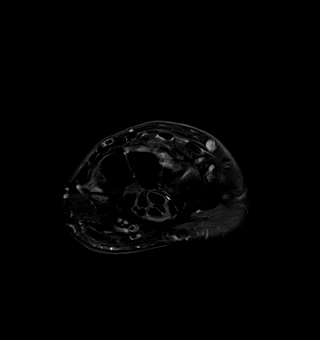
[im 16/25]
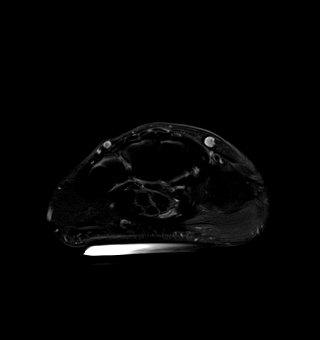
[im 19/25]
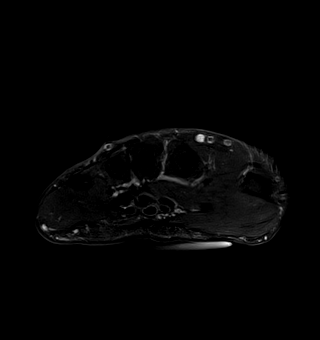
[im 22/25]
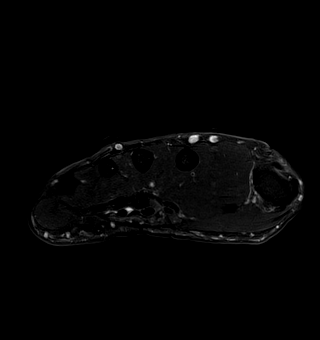
[im 25/25]
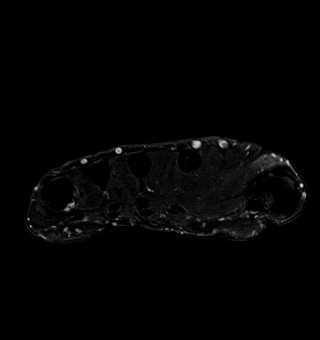

[Series 6: T1 · oblique · left · 3.0mm · 0.34mm/px · 9 of 25 slices shown (1 of 2)]
[im 1/25]
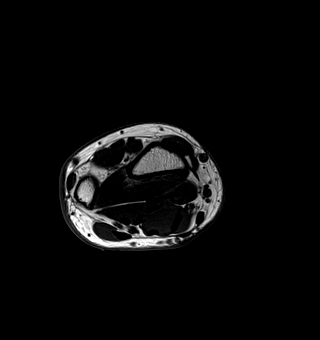
[im 4/25]
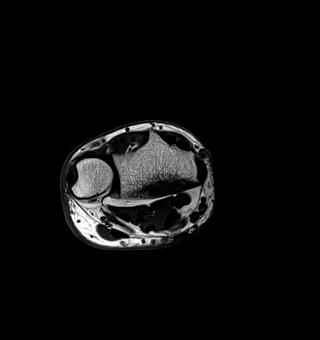
[im 7/25]
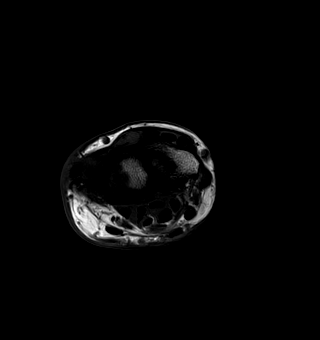
[im 10/25]
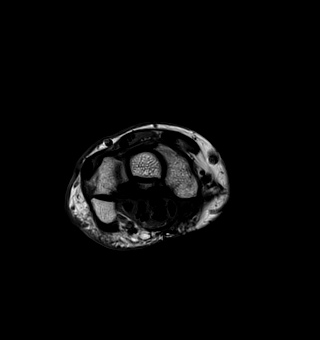
[im 13/25]
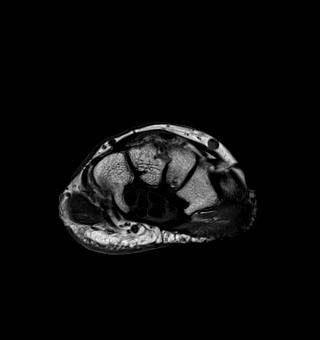
[im 16/25]
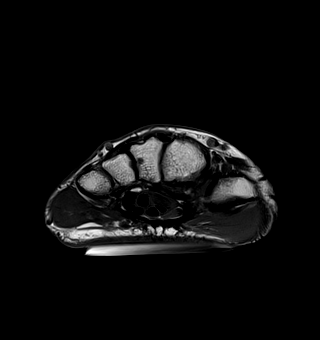
[im 19/25]
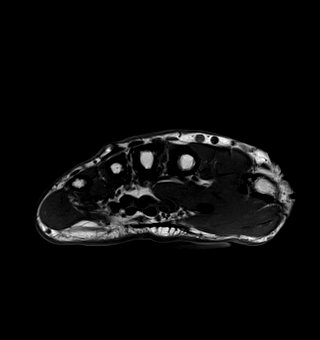
[im 22/25]
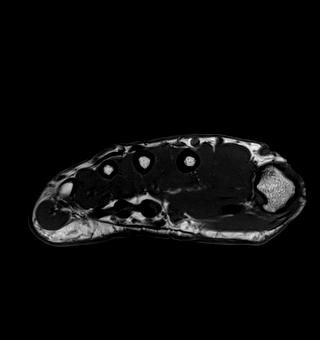
[im 25/25]
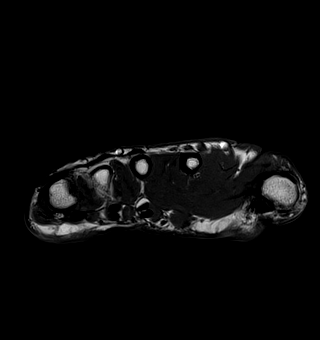

[Series 7: T1 · coronal · left · 3.0mm · 0.26mm/px · 5 of 16 slices shown (2 of 2)]
[im 1/16]
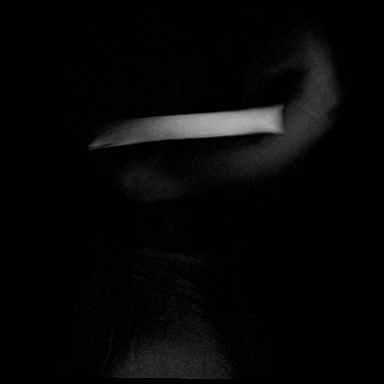
[im 4/16]
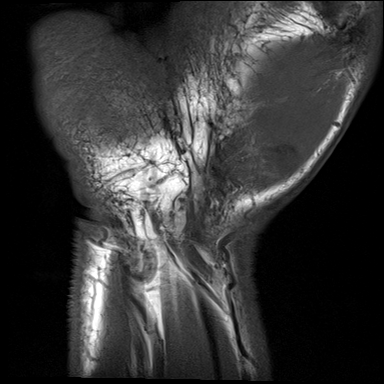
[im 8/16]
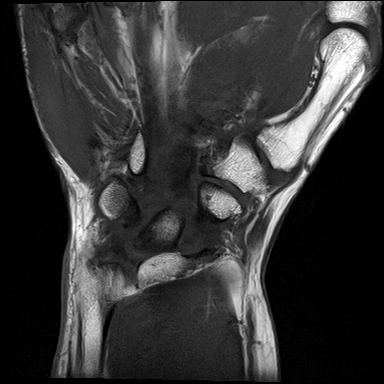
[im 12/16]
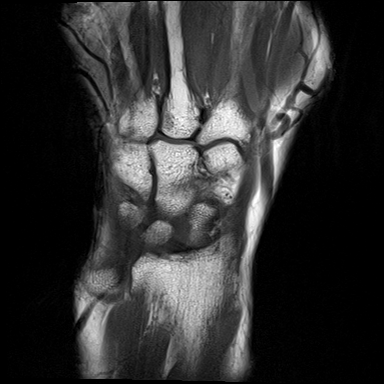
[im 16/16]
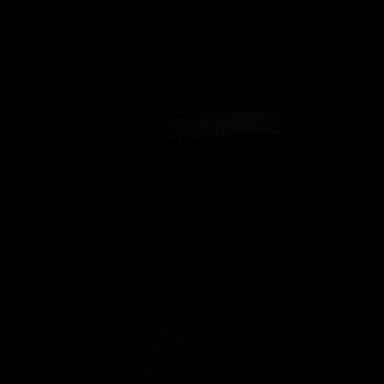

[Series 8: T2 fat-sat · coronal · left · 3.0mm · 0.31mm/px · 5 of 16 slices shown (2 of 2)]
[im 1/16]
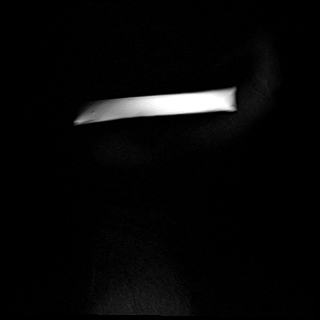
[im 4/16]
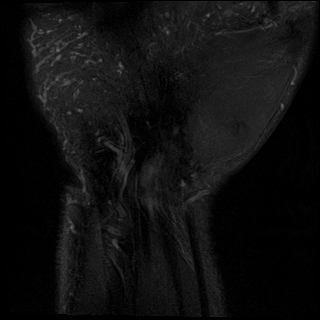
[im 8/16]
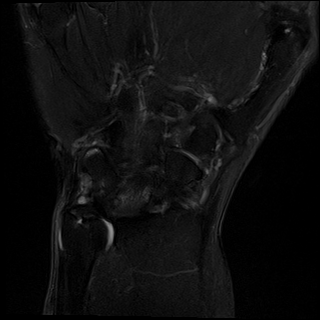
[im 12/16]
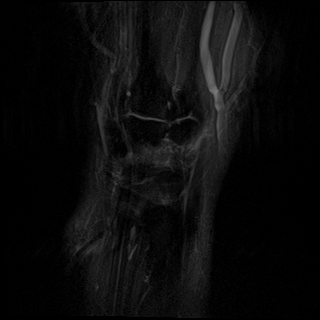
[im 16/16]
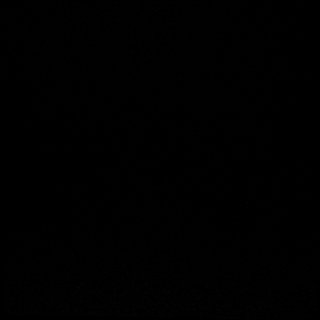

[Series 9: PD fat-sat · coronal · left · 3.0mm · 0.31mm/px · 5 of 16 slices shown (1 of 2)]
[im 1/16]
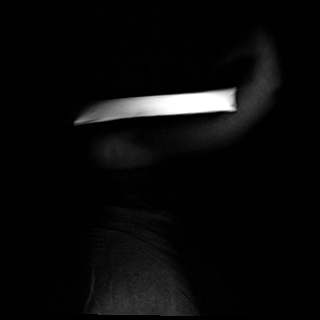
[im 4/16]
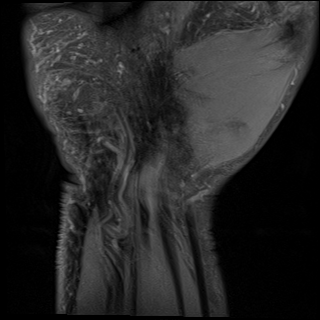
[im 8/16]
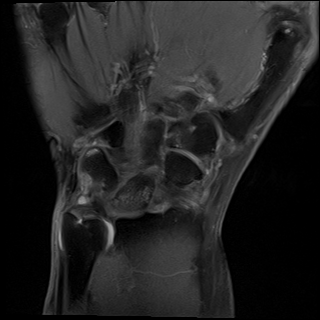
[im 12/16]
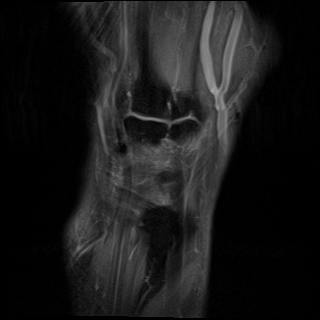
[im 16/16]
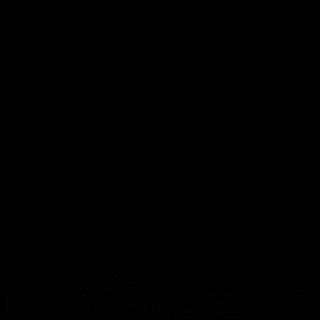

[Series 10: PD fat-sat · oblique · left · 3.0mm · 0.31mm/px · 7 of 22 slices shown (2 of 2)]
[im 1/22]
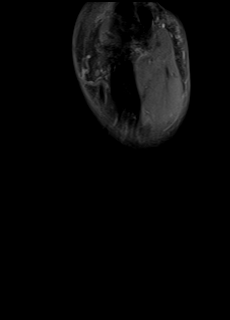
[im 4/22]
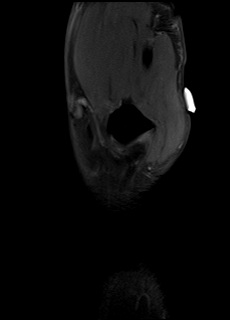
[im 8/22]
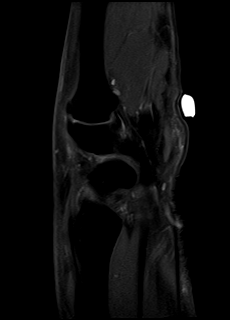
[im 11/22]
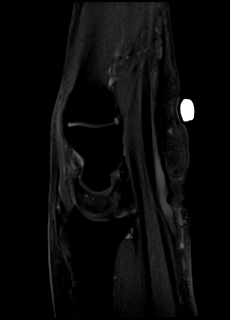
[im 15/22]
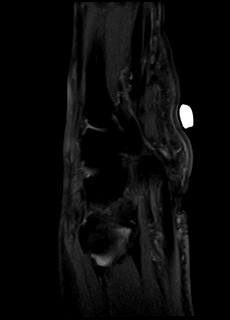
[im 18/22]
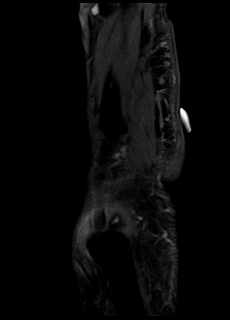
[im 22/22]
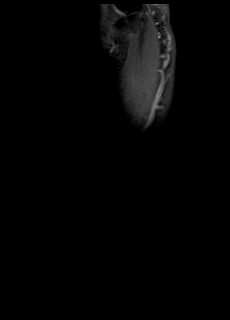

[40 of 40 positions shown; findings below may reference images not displayed]

FINDINGS: Ligaments: Scapholunate and lunotriquetral ligaments are intact.

Triangular fibrocartilage: Central perforation of the TFCC articular
disc and effusion of the distal radioulnar joint.

Tendons: Mild focal tenosynovitis of the extensor carpi ulnaris
tendon at the distal aspect of the ulnar groove (axial T2 image
21-22). The other extensor tendons are unremarkable. The flexor
tendons are unremarkable.

Carpal tunnel/median nerve: Flexor retinaculum is intact. Normal
carpal tunnel without a mass. Median nerve demonstrates normal
signal and caliber.

Guyon's canal: Normal Guyon's canal. Normal ulnar nerve.

Joint/cartilage: Mild chondral thinning along the proximal ulnar
aspect of the lunate.

Bones/carpal alignment: There is bony edema and cystic change in the
lunate and mild arthritis of the scapholunate articulation. Normal
carpal alignment. There is no discrete fracture line.

Other: Muscles are normal. No fluid collection, hematoma, or soft
tissue mass.
IMPRESSION: Central perforation of the TFCC articular disc with distal
radioulnar joint effusion. Bony edema and cystic/erosive change in
the lunate and mild lunate chondrosis. Findings can be seen in ulnar
abutment syndrome, although there is no significant positive ulnar
variance appreciated on this study. No discrete fracture.

Focal mild tenosynovitis of the extensor carpi ulnaris tendon at the
distal aspect of the ulnar groove.

Mild arthritis of the scapholunate articulation.

## 2022-01-09 ENCOUNTER — Encounter: Payer: Self-pay | Admitting: Internal Medicine

## 2022-01-09 ENCOUNTER — Encounter: Payer: Self-pay | Admitting: Nurse Practitioner

## 2022-01-09 ENCOUNTER — Other Ambulatory Visit: Payer: Self-pay

## 2022-01-09 ENCOUNTER — Ambulatory Visit (INDEPENDENT_AMBULATORY_CARE_PROVIDER_SITE_OTHER): Payer: Medicare HMO | Admitting: Nurse Practitioner

## 2022-01-09 VITALS — BP 180/100 | HR 75 | Temp 97.8°F | Resp 12 | Ht 60.5 in | Wt 151.5 lb

## 2022-01-09 DIAGNOSIS — I10 Essential (primary) hypertension: Secondary | ICD-10-CM | POA: Diagnosis not present

## 2022-01-09 DIAGNOSIS — H1032 Unspecified acute conjunctivitis, left eye: Secondary | ICD-10-CM | POA: Diagnosis not present

## 2022-01-09 LAB — BASIC METABOLIC PANEL
BUN: 10 mg/dL (ref 6–23)
CO2: 28 mEq/L (ref 19–32)
Calcium: 9.3 mg/dL (ref 8.4–10.5)
Chloride: 98 mEq/L (ref 96–112)
Creatinine, Ser: 0.62 mg/dL (ref 0.40–1.20)
GFR: 91.79 mL/min (ref >60.00)
Glucose, Bld: 138 mg/dL — ABNORMAL HIGH (ref 70–99)
Potassium: 4.4 mEq/L (ref 3.5–5.1)
Sodium: 135 mEq/L (ref 135–145)

## 2022-01-09 MED ORDER — OLMESARTAN MEDOXOMIL 20 MG PO TABS
20.0000 mg | ORAL_TABLET | Freq: Every day | ORAL | 1 refills | Status: DC
Start: 2022-01-09 — End: 2022-02-20

## 2022-01-09 MED ORDER — ERYTHROMYCIN 5 MG/GM OP OINT: 1.0000 "application " | TOPICAL_OINTMENT | Freq: Three times a day (TID) | OPHTHALMIC | 0 refills | Status: DC

## 2022-01-09 NOTE — Telephone Encounter (Signed)
I spoke with pt; per pt message she was working outside and thinks got some type of debris in eye; pt flushed the eye and pt said last week no problem from eye. Pt was outside again working in yard on 01/08/22, wind blowing and pt is not sure if some type debris went into lt eye or not but this morning pt said lt eye is red and irritated feeling like something is in her eye, slight clear drainage from eye. No pain in lt eye just feels irritated. No fever or respiratory symptoms. Pt scheduled appt to see Romilda Garret NP 01/09/22 at 10:40. UC & ED precautions given and pt voiced understanding. ?

## 2022-01-09 NOTE — Assessment & Plan Note (Signed)
Patient's fluorescein eye exam was negative in office.  Likely conjunctivitis giving course symptoms and presentation.  Will prescribe erythromycin ointment 3 times daily left eye for 7 days.  Did discuss to not wear contact lenses, which patient does not.  If she has any vision changes or problems she will follow-up or go see her eye care professional. ?

## 2022-01-09 NOTE — Patient Instructions (Signed)
Nice to see you  ?Follow up with Dr. Silvio Pate in 1 month to check kidney and potassium again ? ?

## 2022-01-09 NOTE — Telephone Encounter (Signed)
Patient evaluated in office 

## 2022-01-09 NOTE — Progress Notes (Signed)
? ?Acute Office Visit ? ?Subjective:  ? ? Patient ID: Catherine Dalton, female    DOB: [birth year], 66 y.o.   MRN: [id number] ? ?Chief Complaint  ?Patient presents with  ? Eye Problem  ?  Started yesterday after working in the yard and the wind blew and wonders if something fell in her left eye. Eye red and irritated, slight clear drainage from the eye. Patient had similar issue happened 3 weeks ago but at that time had some green discharge from the eye. She has used OTC Polyethylene glycol 0.3% drops  ? ? ? ?Patient is in today for eye problem ? ?States that approx 3 weeks ago she got debris in her eye after working outside [place] that she flushed it and started using over the counter eye drops that seemed to help. ?By then end of the first week she noticed some discharge. ?States by the second week she got some green discharge for the lacrimal portion of her eye. States that it cleared up after that.  ?States yesterday it was fine and then woke up this morning with eye redness and crusting. Feels gritty and some itching but no more than normal ? ?Wears glasses and no contacts. Concerned for foreign object in her eye ? ?Past Medical History:  ?Diagnosis Date  ? Allergic rhinitis due to pollen   ? Essential hypertension, benign 2010  ? didn't tolerate meds  ? ? ?Past Surgical History:  ?Procedure Laterality Date  ? ABDOMINAL HYSTERECTOMY    ? Fibroids  ? [place]  ? BASAL CELL CARCINOMA EXCISION  2014  ? Face  ? Carotid duplex  02/10/13  ? Normal  ? CARPAL TUNNEL RELEASE  2016  ? Dr Ninfa Linden  ? Echo/stress test  2014  ? Normal  ? TUBAL LIGATION    ? ? ?Family History  ?Problem Relation Age of Onset  ? Heart disease Mother   ? Stroke Mother   ? Hypertension Mother   ? Hyperlipidemia Mother   ? Cancer Sister   ?     breast cancer  ? Asthma Father   ? Heart disease Brother   ? Cancer Paternal Grandmother   ?     colon cancer  ? Autoimmune disease Other   ? ? ?Social History  ? ?Socioeconomic History  ?  Marital status: Married  ?  Spouse name: Not on file  ? Number of children: 3  ? Years of education: Not on file  ? Highest education level: Not on file  ?Occupational History  ? Occupation: [employer]  ?  Comment: Retired  ?Tobacco Use  ? Smoking status: Former  ? Smokeless tobacco: Never  ?Substance and Sexual Activity  ? Alcohol use: Yes  ?  Alcohol/week: 0.0 standard drinks  ?  Comment: rare glass of wine  ? Drug use: No  ? Sexual activity: Yes  ?Other Topics Concern  ? Not on file  ?Social History Narrative  ? Has living will  ? Husband is health care POA. Sister Leonette Monarch is alternate  ? Would accept resuscitation but no prolonged ventilation  ? No tube feeds if cognitively unaware  ? ?Social Determinants of Health  ? ?Financial Resource Strain: Medium Risk  ? Difficulty of Paying Living Expenses: Somewhat hard  ?Food Insecurity: No Food Insecurity  ? Worried About Charity fundraiser in the Last Year: Never true  ? Ran Out of Food in the Last Year: Never true  ?Transportation Needs:  No Transportation Needs  ? Lack of Transportation (Medical): No  ? Lack of Transportation (Non-Medical): No  ?Physical Activity: Insufficiently Active  ? Days of Exercise per Week: 3 days  ? Minutes of Exercise per Session: 20 min  ?Stress: No Stress Concern Present  ? Feeling of Stress : Not at all  ?Social Connections: Socially Integrated  ? Frequency of Communication with Friends and Family: More than three times a week  ? Frequency of Social Gatherings with Friends and Family: Three times a week  ? Attends Religious Services: More than 4 times per year  ? Active Member of Clubs or Organizations: Yes  ? Attends Archivist Meetings: More than 4 times per year  ? Marital Status: Married  ?Intimate Partner Violence: Not At Risk  ? Fear of Current or Ex-Partner: No  ? Emotionally Abused: No  ? Physically Abused: No  ? Sexually Abused: No  ? ? ?Outpatient Medications Prior to Visit  ?Medication Sig Dispense  Refill  ? aspirin 325 MG tablet Take 325 mg by mouth daily as needed. For pain    ? cetirizine (ZYRTEC) 10 MG tablet Take 10 mg by mouth daily.    ? predniSONE (DELTASONE) 20 MG tablet Take 2 tablets (40 mg total) by mouth daily. 6 tablet 0  ? ?No facility-administered medications prior to visit.  ? ? ?No Known Allergies ? ?Review of Systems  ?Constitutional:  Negative for chills and fever.  ?HENT:  Negative for congestion, ear discharge, ear pain, sinus pressure, sinus pain and sore throat.   ?Eyes:  Positive for discharge, redness and itching. Negative for photophobia, pain and visual disturbance.  ?Neurological:  Negative for headaches.  ? ?   ?Objective:  ?  ?Physical Exam ?Vitals and nursing note reviewed.  ?Constitutional:   ?   Appearance: Normal appearance.  ?HENT:  ?   Right Ear: Tympanic membrane, ear canal and external ear normal.  ?   Left Ear: Tympanic membrane, ear canal and external ear normal.  ?   Mouth/Throat:  ?   Mouth: Mucous membranes are dry.  ?   Pharynx: Oropharynx is clear.  ?Eyes:  ?   Extraocular Movements: Extraocular movements intact.  ?   Pupils: Pupils are equal, round, and reactive to light.  ?   Comments: Left conjunctive red. ?Negative fluricen exam in office ?No visual changes  ?Lymphadenopathy:  ?   Cervical: No cervical adenopathy.  ?Skin: ?   General: Skin is warm.  ?Neurological:  ?   Mental Status: She is alert.  ? ? ?BP (!) 180/100   Pulse 75   Temp 97.8 ?F (36.6 ?C)   Resp 12   Ht 5' 0.5" (1.537 m)   Wt 151 lb 8 oz (68.7 kg)   SpO2 98%   BMI 29.10 kg/m?  ?Wt Readings from Last 3 Encounters:  ?01/09/22 151 lb 8 oz (68.7 kg)  ?07/05/21 150 lb (68 kg)  ?12/12/20 151 lb (68.5 kg)  ? ? ?Health Maintenance Due  ?Topic Date Due  ? Hepatitis C Screening  Never done  ? COLONOSCOPY (Pts 45-23yr Insurance coverage will need to be confirmed)  Never done  ? Zoster Vaccines- Shingrix (1 of 2) Never done  ? MAMMOGRAM  02/11/2015  ? DEXA SCAN  Never done  ? Pneumonia Vaccine 68  Years old (2 - PPSV23 if available, else PCV20) 05/16/2020  ? ? ?There are no preventive care reminders to display for this patient. ? ? ?No results found  for: TSH ?Lab Results  ?Component Value Date  ? WBC 6.6 05/17/2019  ? HGB 14.1 05/17/2019  ? HCT 42.4 05/17/2019  ? MCV 90.9 05/17/2019  ? PLT 276.0 05/17/2019  ? ?Lab Results  ?Component Value Date  ? NA 138 05/17/2019  ? K 4.0 05/17/2019  ? CO2 25 05/17/2019  ? GLUCOSE 86 05/17/2019  ? BUN 14 05/17/2019  ? CREATININE 0.62 05/17/2019  ? BILITOT 1.2 05/17/2019  ? ALKPHOS 94 05/17/2019  ? AST 17 05/17/2019  ? ALT 15 05/17/2019  ? PROT 7.1 05/17/2019  ? ALBUMIN 4.5 05/17/2019  ? CALCIUM 9.5 05/17/2019  ? GFR 96.51 05/17/2019  ? ?Lab Results  ?Component Value Date  ? CHOL 275 (H) 05/17/2019  ? ?Lab Results  ?Component Value Date  ? HDL 65.20 05/17/2019  ? ?Lab Results  ?Component Value Date  ? LDLCALC 170 (H) 05/17/2019  ? ?Lab Results  ?Component Value Date  ? TRIG 196.0 (H) 05/17/2019  ? ?Lab Results  ?Component Value Date  ? CHOLHDL 4 05/17/2019  ? ?No results found for: HGBA1C ? ?   ?Assessment & Plan:  ? ?Problem List Items Addressed This Visit   ? ?  ? Cardiovascular and Mediastinum  ? Essential hypertension, benign  ?  Patient has a history of hypertension that was managed off of blood pressure medication.  Patient has not been in the office an extended period of time.  Patient's blood pressure developed elevated in office today and upon recheck was about the same.  She has tried HCTZ in the past and losartan per chart states that it makes her feel very tired.  We will check a BMP today to make sure kidneys and potassium look okay we will start patient on olmesartan 20 mg daily.  Have her follow-up in 1 month with Dr. Silvio Pate for recheck of blood pressure.  Did encourage patient to check blood pressure at home ?  ?  ? Relevant Medications  ? olmesartan (BENICAR) 20 MG tablet  ? Other Relevant Orders  ? Basic metabolic panel  ?  ? Other  ? Acute bacterial  conjunctivitis of left eye - Primary  ?  Patient's fluorescein eye exam was negative in office.  Likely conjunctivitis giving course symptoms and presentation.  Will prescribe erythromycin ointment 3 times daily left e

## 2022-01-09 NOTE — Assessment & Plan Note (Signed)
Patient has a history of hypertension that was managed off of blood pressure medication.  Patient has not been in the office an extended period of time.  Patient's blood pressure developed elevated in office today and upon recheck was about the same.  She has tried HCTZ in the past and losartan per chart states that it makes her feel very tired.  We will check a BMP today to make sure kidneys and potassium look okay we will start patient on olmesartan 20 mg daily.  Have her follow-up in 1 month with Dr. Silvio Pate for recheck of blood pressure.  Did encourage patient to check blood pressure at home ?

## 2022-01-18 DIAGNOSIS — M25532 Pain in left wrist: Secondary | ICD-10-CM | POA: Diagnosis not present

## 2022-01-18 DIAGNOSIS — M25632 Stiffness of left wrist, not elsewhere classified: Secondary | ICD-10-CM | POA: Diagnosis not present

## 2022-01-18 DIAGNOSIS — S63592A Other specified sprain of left wrist, initial encounter: Secondary | ICD-10-CM | POA: Diagnosis not present

## 2022-01-21 DIAGNOSIS — M25532 Pain in left wrist: Secondary | ICD-10-CM | POA: Diagnosis not present

## 2022-01-21 DIAGNOSIS — M25632 Stiffness of left wrist, not elsewhere classified: Secondary | ICD-10-CM | POA: Diagnosis not present

## 2022-01-21 DIAGNOSIS — R29898 Other symptoms and signs involving the musculoskeletal system: Secondary | ICD-10-CM | POA: Diagnosis not present

## 2022-01-24 DIAGNOSIS — L821 Other seborrheic keratosis: Secondary | ICD-10-CM | POA: Diagnosis not present

## 2022-01-24 DIAGNOSIS — S63592A Other specified sprain of left wrist, initial encounter: Secondary | ICD-10-CM | POA: Diagnosis not present

## 2022-01-24 DIAGNOSIS — M25532 Pain in left wrist: Secondary | ICD-10-CM | POA: Diagnosis not present

## 2022-01-24 DIAGNOSIS — L820 Inflamed seborrheic keratosis: Secondary | ICD-10-CM | POA: Diagnosis not present

## 2022-01-24 DIAGNOSIS — R29898 Other symptoms and signs involving the musculoskeletal system: Secondary | ICD-10-CM | POA: Diagnosis not present

## 2022-01-24 DIAGNOSIS — R208 Other disturbances of skin sensation: Secondary | ICD-10-CM | POA: Diagnosis not present

## 2022-01-24 DIAGNOSIS — L298 Other pruritus: Secondary | ICD-10-CM | POA: Diagnosis not present

## 2022-01-24 DIAGNOSIS — D2372 Other benign neoplasm of skin of left lower limb, including hip: Secondary | ICD-10-CM | POA: Diagnosis not present

## 2022-01-24 DIAGNOSIS — L814 Other melanin hyperpigmentation: Secondary | ICD-10-CM | POA: Diagnosis not present

## 2022-01-24 DIAGNOSIS — M25632 Stiffness of left wrist, not elsewhere classified: Secondary | ICD-10-CM | POA: Diagnosis not present

## 2022-01-24 DIAGNOSIS — L218 Other seborrheic dermatitis: Secondary | ICD-10-CM | POA: Diagnosis not present

## 2022-01-24 DIAGNOSIS — L2089 Other atopic dermatitis: Secondary | ICD-10-CM | POA: Diagnosis not present

## 2022-01-24 DIAGNOSIS — L538 Other specified erythematous conditions: Secondary | ICD-10-CM | POA: Diagnosis not present

## 2022-01-24 DIAGNOSIS — L308 Other specified dermatitis: Secondary | ICD-10-CM | POA: Diagnosis not present

## 2022-01-28 ENCOUNTER — Other Ambulatory Visit: Payer: Self-pay | Admitting: Internal Medicine

## 2022-01-28 DIAGNOSIS — M25532 Pain in left wrist: Secondary | ICD-10-CM | POA: Diagnosis not present

## 2022-01-28 DIAGNOSIS — S63592S Other specified sprain of left wrist, sequela: Secondary | ICD-10-CM | POA: Diagnosis not present

## 2022-01-28 DIAGNOSIS — R29898 Other symptoms and signs involving the musculoskeletal system: Secondary | ICD-10-CM | POA: Diagnosis not present

## 2022-01-28 DIAGNOSIS — I10 Essential (primary) hypertension: Secondary | ICD-10-CM

## 2022-01-28 DIAGNOSIS — M25632 Stiffness of left wrist, not elsewhere classified: Secondary | ICD-10-CM | POA: Diagnosis not present

## 2022-01-28 DIAGNOSIS — E785 Hyperlipidemia, unspecified: Secondary | ICD-10-CM

## 2022-01-31 ENCOUNTER — Other Ambulatory Visit: Payer: Self-pay | Admitting: Nurse Practitioner

## 2022-01-31 DIAGNOSIS — I10 Essential (primary) hypertension: Secondary | ICD-10-CM

## 2022-02-11 ENCOUNTER — Other Ambulatory Visit (INDEPENDENT_AMBULATORY_CARE_PROVIDER_SITE_OTHER): Payer: Medicare HMO

## 2022-02-11 DIAGNOSIS — E785 Hyperlipidemia, unspecified: Secondary | ICD-10-CM

## 2022-02-11 DIAGNOSIS — I10 Essential (primary) hypertension: Secondary | ICD-10-CM

## 2022-02-11 LAB — COMPREHENSIVE METABOLIC PANEL
ALT: 17 U/L (ref 0–35)
AST: 20 U/L (ref 0–37)
Albumin: 4.3 g/dL (ref 3.5–5.2)
Alkaline Phosphatase: 95 U/L (ref 39–117)
BUN: 11 mg/dL (ref 6–23)
CO2: 28 mEq/L (ref 19–32)
Calcium: 9 mg/dL (ref 8.4–10.5)
Chloride: 104 mEq/L (ref 96–112)
Creatinine, Ser: 0.75 mg/dL (ref 0.40–1.20)
GFR: 82.01 mL/min (ref >60.00)
Glucose, Bld: 109 mg/dL — ABNORMAL HIGH (ref 70–99)
Potassium: 4.5 mEq/L (ref 3.5–5.1)
Sodium: 138 mEq/L (ref 135–145)
Total Bilirubin: 1 mg/dL (ref 0.2–1.2)
Total Protein: 7.1 g/dL (ref 6.0–8.3)

## 2022-02-11 LAB — CBC
HCT: 40.8 % (ref 36.0–46.0)
Hemoglobin: 13.6 g/dL (ref 12.0–15.0)
MCHC: 33.3 g/dL (ref 30.0–36.0)
MCV: 90.6 fl (ref 78.0–100.0)
Platelets: 285 10*3/uL (ref 150.0–400.0)
RBC: 4.5 Mil/uL (ref 3.87–5.11)
RDW: 13.2 % (ref 11.5–15.5)
WBC: 5.7 10*3/uL (ref 4.0–10.5)

## 2022-02-11 LAB — LIPID PANEL
Cholesterol: 241 mg/dL — ABNORMAL HIGH (ref 0–200)
HDL: 64.7 mg/dL (ref >39.00)
NonHDL: 176.41
Total CHOL/HDL Ratio: 4
Triglycerides: 221 mg/dL — ABNORMAL HIGH (ref 0.0–149.0)
VLDL: 44.2 mg/dL — ABNORMAL HIGH (ref 0.0–40.0)

## 2022-02-11 LAB — LDL CHOLESTEROL, DIRECT: Direct LDL: 166 mg/dL

## 2022-02-20 ENCOUNTER — Encounter: Payer: Self-pay | Admitting: Internal Medicine

## 2022-02-20 ENCOUNTER — Ambulatory Visit (INDEPENDENT_AMBULATORY_CARE_PROVIDER_SITE_OTHER): Payer: Medicare HMO | Admitting: Internal Medicine

## 2022-02-20 VITALS — BP 140/88 | HR 72 | Temp 98.0°F | Ht 60.0 in | Wt 150.0 lb

## 2022-02-20 DIAGNOSIS — Z1211 Encounter for screening for malignant neoplasm of colon: Secondary | ICD-10-CM | POA: Diagnosis not present

## 2022-02-20 DIAGNOSIS — E785 Hyperlipidemia, unspecified: Secondary | ICD-10-CM

## 2022-02-20 DIAGNOSIS — I10 Essential (primary) hypertension: Secondary | ICD-10-CM | POA: Diagnosis not present

## 2022-02-20 DIAGNOSIS — Z0000 Encounter for general adult medical examination without abnormal findings: Secondary | ICD-10-CM | POA: Diagnosis not present

## 2022-02-20 NOTE — Assessment & Plan Note (Signed)
BP Readings from Last 3 Encounters:  ?02/20/22 140/88  ?01/09/22 (!) 180/100  ?07/05/21 (!) 148/96  ? ?Did like the olmesartan ?BP is better today ?Will hold off on Rx again ?

## 2022-02-20 NOTE — Progress Notes (Signed)
Vision Screening  ? Right eye Left eye Both eyes  ?Without correction     ?With correction '20/15 20/15 20/13 '$  ?Hearing Screening - Comments:: Has hearing aids. Not wearing them today. ? ?

## 2022-02-20 NOTE — Assessment & Plan Note (Signed)
Healthy ?Discussed more aerobic exercise ?Way overdue for [facility] her to set it up ?Will do FIT ?No pap due to age ?COVID vaccine by the fall---flu vaccine in the fall ?shingrix at pharmacy ?

## 2022-02-20 NOTE — Assessment & Plan Note (Signed)
Moderate elevations ?Discussed primary prevention with statin--she prefers not ?

## 2022-02-20 NOTE — Patient Instructions (Signed)
Please set up your screening mammogram. 

## 2022-02-20 NOTE — Progress Notes (Signed)
? ?Subjective:  ? ? Patient ID: Catherine Dalton, female    DOB: 03/14/1956, 66 y.o.   MRN: [id number] ? ?HPI ?Here for physical ? ?Had been on olmesartan here due to elevated BP ?When here with eye infection ?She didn't feel good on it---so stopped it (had leg cramps) ? ?BP may have been high due to recent wrist surgery--by Dr Burney Gauze ?This is better now ? ?Still exercises---- lots of yard work ?Does walk some --with dogs ? ?Current Outpatient Medications on File Prior to Visit  ?Medication Sig Dispense Refill  ? aspirin 325 MG tablet Take 325 mg by mouth daily as needed. For pain    ? cetirizine (ZYRTEC) 10 MG tablet Take 10 mg by mouth daily as needed.    ? ?No current facility-administered medications on file prior to visit.  ? ? ?No Known Allergies ? ?Past Medical History:  ?Diagnosis Date  ? Allergic rhinitis due to pollen   ? Essential hypertension, benign 2010  ? didn't tolerate meds  ? ? ?Past Surgical History:  ?Procedure Laterality Date  ? ABDOMINAL HYSTERECTOMY    ? Fibroids  ? [place]  ? BASAL CELL CARCINOMA EXCISION  2014  ? Face  ? Carotid duplex  02/10/13  ? Normal  ? CARPAL TUNNEL RELEASE  2016  ? Dr Ninfa Linden  ? Echo/stress test  2014  ? Normal  ? TUBAL LIGATION    ? ? ?Family History  ?Problem Relation Age of Onset  ? Heart disease Mother   ? Stroke Mother   ? Hypertension Mother   ? Hyperlipidemia Mother   ? Cancer Sister   ?     breast cancer  ? Asthma Father   ? Heart disease Brother   ? Cancer Paternal Grandmother   ?     colon cancer  ? Autoimmune disease Other   ? ? ?Social History  ? ?Socioeconomic History  ? Marital status: Married  ?  Spouse name: Not on file  ? Number of children: 3  ? Years of education: Not on file  ? Highest education level: Not on file  ?Occupational History  ? Occupation: [employer]  ?  Comment: Retired  ?Tobacco Use  ? Smoking status: Former  ?  Passive exposure: Current  ? Smokeless tobacco: Never  ?Substance and Sexual Activity  ?  Alcohol use: Yes  ?  Alcohol/week: 0.0 standard drinks  ?  Comment: rare glass of wine  ? Drug use: No  ? Sexual activity: Yes  ?Other Topics Concern  ? Not on file  ?Social History Narrative  ? Has living will  ? Husband is health care POA. Sister Leonette Monarch is alternate  ? Would accept resuscitation but no prolonged ventilation  ? No tube feeds if cognitively unaware  ? ?Social Determinants of Health  ? ?Financial Resource Strain: Medium Risk  ? Difficulty of Paying Living Expenses: Somewhat hard  ?Food Insecurity: No Food Insecurity  ? Worried About Charity fundraiser in the Last Year: Never true  ? Ran Out of Food in the Last Year: Never true  ?Transportation Needs: No Transportation Needs  ? Lack of Transportation (Medical): No  ? Lack of Transportation (Non-Medical): No  ?Physical Activity: Insufficiently Active  ? Days of Exercise per Week: 3 days  ? Minutes of Exercise per Session: 20 min  ?Stress: No Stress Concern Present  ? Feeling of Stress : Not at all  ?Social Connections: Socially Integrated  ?  Frequency of Communication with Friends and Family: More than three times a week  ? Frequency of Social Gatherings with Friends and Family: Three times a week  ? Attends Religious Services: More than 4 times per year  ? Active Member of Clubs or Organizations: Yes  ? Attends Archivist Meetings: More than 4 times per year  ? Marital Status: Married  ?Intimate Partner Violence: Not At Risk  ? Fear of Current or Ex-Partner: No  ? Emotionally Abused: No  ? Physically Abused: No  ? Sexually Abused: No  ? ?Review of Systems  ?Constitutional:  Negative for fatigue and unexpected weight change.  ?     Wears seat belt  ?HENT:  Positive for hearing loss. Negative for dental problem and tinnitus.   ?     Due for dental visit  ?Eyes:  Negative for visual disturbance.  ?     No diplopia or unilateral vision loss  ?Respiratory:  Negative for chest tightness and shortness of breath.   ?     Slight cough only with  allergies  ?Cardiovascular:  Negative for chest pain and leg swelling.  ?     Gets rare "roll" feeling in her heart (?skip)  ?Gastrointestinal:  Negative for blood in stool and constipation.  ?     No heartburn---or rare  ?Endocrine: Negative for polydipsia.  ?Genitourinary:  Positive for frequency. Negative for dyspareunia, dysuria and hematuria.  ?Musculoskeletal:  Negative for back pain and joint swelling.  ?     No other joint issues besides wrist  ?Skin:  Negative for rash.  ?     Does see derm  ?Allergic/Immunologic: Positive for environmental allergies. Negative for immunocompromised state.  ?     Now using loratadine for her symptoms  ?Neurological:  Negative for dizziness, syncope, light-headedness and headaches.  ?Hematological:  Negative for adenopathy. Does not bruise/bleed easily.  ?Psychiatric/Behavioral:  Negative for dysphoric mood and sleep disturbance. The patient is not nervous/anxious.   ? ?   ?Objective:  ? Physical Exam ?Constitutional:   ?   Appearance: Normal appearance.  ?HENT:  ?   Mouth/Throat:  ?   Pharynx: No oropharyngeal exudate or posterior oropharyngeal erythema.  ?Eyes:  ?   Conjunctiva/sclera: Conjunctivae normal.  ?   Pupils: Pupils are equal, round, and reactive to light.  ?Cardiovascular:  ?   Rate and Rhythm: Normal rate and regular rhythm.  ?   Pulses: Normal pulses.  ?   Heart sounds: No murmur heard. ?  No gallop.  ?Pulmonary:  ?   Effort: Pulmonary effort is normal.  ?   Breath sounds: Normal breath sounds. No wheezing or rales.  ?Abdominal:  ?   Palpations: Abdomen is soft.  ?   Tenderness: There is no abdominal tenderness.  ?Musculoskeletal:  ?   Cervical back: Neck supple.  ?   Right lower leg: No edema.  ?   Left lower leg: No edema.  ?Lymphadenopathy:  ?   Cervical: No cervical adenopathy.  ?Skin: ?   Findings: No lesion or rash.  ?Neurological:  ?   General: No focal deficit present.  ?   Mental Status: She is alert and oriented to person, place, and time.   ?Psychiatric:     ?   Mood and Affect: Mood normal.     ?   Behavior: Behavior normal.  ?  ? ? ? ? ?   ?Assessment & Plan:  ? ?

## 2022-02-21 ENCOUNTER — Other Ambulatory Visit (INDEPENDENT_AMBULATORY_CARE_PROVIDER_SITE_OTHER): Payer: Medicare HMO

## 2022-02-21 DIAGNOSIS — Z1211 Encounter for screening for malignant neoplasm of colon: Secondary | ICD-10-CM | POA: Diagnosis not present

## 2022-02-22 LAB — FECAL OCCULT BLOOD, IMMUNOCHEMICAL: Fecal Occult Bld: NEGATIVE

## 2022-03-26 DIAGNOSIS — M47816 Spondylosis without myelopathy or radiculopathy, lumbar region: Secondary | ICD-10-CM | POA: Diagnosis not present

## 2022-03-26 DIAGNOSIS — M25551 Pain in right hip: Secondary | ICD-10-CM | POA: Diagnosis not present

## 2022-04-29 DIAGNOSIS — H524 Presbyopia: Secondary | ICD-10-CM | POA: Diagnosis not present

## 2022-04-29 DIAGNOSIS — H43813 Vitreous degeneration, bilateral: Secondary | ICD-10-CM | POA: Diagnosis not present

## 2022-04-29 DIAGNOSIS — Z135 Encounter for screening for eye and ear disorders: Secondary | ICD-10-CM | POA: Diagnosis not present

## 2022-04-29 DIAGNOSIS — H52223 Regular astigmatism, bilateral: Secondary | ICD-10-CM | POA: Diagnosis not present

## 2022-04-29 DIAGNOSIS — H1045 Other chronic allergic conjunctivitis: Secondary | ICD-10-CM | POA: Diagnosis not present

## 2022-06-21 DIAGNOSIS — R079 Chest pain, unspecified: Secondary | ICD-10-CM | POA: Diagnosis not present

## 2022-07-26 ENCOUNTER — Ambulatory Visit (INDEPENDENT_AMBULATORY_CARE_PROVIDER_SITE_OTHER): Payer: Medicare HMO

## 2022-07-26 DIAGNOSIS — Z23 Encounter for immunization: Secondary | ICD-10-CM | POA: Diagnosis not present

## 2022-10-01 ENCOUNTER — Encounter: Payer: Self-pay | Admitting: Nurse Practitioner

## 2022-10-01 ENCOUNTER — Ambulatory Visit (INDEPENDENT_AMBULATORY_CARE_PROVIDER_SITE_OTHER): Payer: Medicare HMO | Admitting: Nurse Practitioner

## 2022-10-01 VITALS — BP 150/88 | HR 73 | Temp 98.1°F | Resp 16 | Ht 60.0 in | Wt 150.0 lb

## 2022-10-01 DIAGNOSIS — M25512 Pain in left shoulder: Secondary | ICD-10-CM | POA: Insufficient documentation

## 2022-10-01 DIAGNOSIS — I10 Essential (primary) hypertension: Secondary | ICD-10-CM | POA: Diagnosis not present

## 2022-10-01 MED ORDER — PREDNISONE 20 MG PO TABS
ORAL_TABLET | ORAL | 0 refills | Status: AC
Start: 2022-10-01 — End: 2022-10-09

## 2022-10-01 NOTE — Assessment & Plan Note (Signed)
Patient currently tolerating medications.  Blood pressure did come down on recheck still above goal.  Follow-up with primary care provider

## 2022-10-01 NOTE — Assessment & Plan Note (Signed)
No injury.  Likely overuse injury.  Will treat with prednisone taper.  Patient avoid NSAIDs while on steroids.  Follow-up if no improvement.  Defer imaging at this juncture

## 2022-10-01 NOTE — Progress Notes (Signed)
Acute Office Visit  Subjective:     Patient ID: Catherine Dalton, female    DOB: 05-16-1956, 66 y.o.   MRN: [id number]  Chief Complaint  Patient presents with  . Shoulder Pain    Left shoulder X 1 week and has a knot.Sunday it started getting worse.    HPI Patient is in today for shoulder pain   States that it started a week ago.  States that she has had no injury States that she has a backpack leaf blower, last use was 1-1.5 States that it is burning, sharp, dull pain with crepitius.  States that it is goiing up the neck behind her eyeball. Hurts at rest and with movement States she has used biofreeze, took 3 ASA, has used esstenial oils   Review of Systems  Constitutional:  Negative for chills and fever.  Respiratory:  Negative for shortness of breath.   Cardiovascular:  Negative for chest pain.  Musculoskeletal:  Positive for joint pain.  Neurological:  Positive for tingling. Negative for weakness.        Objective:    BP (!) 150/88   Pulse 73   Temp 98.1 F (36.7 C)   Resp 16   Ht 5' (1.524 m)   Wt 150 lb (68 kg)   SpO2 97%   BMI 29.29 kg/m  BP Readings from Last 3 Encounters:  10/01/22 (!) 150/88  02/20/22 140/88  01/09/22 (!) 180/100   Wt Readings from Last 3 Encounters:  10/01/22 150 lb (68 kg)  02/20/22 150 lb (68 kg)  01/09/22 151 lb 8 oz (68.7 kg)      Physical Exam Vitals and nursing note reviewed.  Constitutional:      Appearance: Normal appearance.  Cardiovascular:     Rate and Rhythm: Normal rate and regular rhythm.     Pulses:          Radial pulses are 2+ on the right side and 2+ on the left side.     Heart sounds: Normal heart sounds.  Pulmonary:     Effort: Pulmonary effort is normal.     Breath sounds: Normal breath sounds.  Musculoskeletal:     Right shoulder: Normal.     Left shoulder: Tenderness present. No deformity. Decreased range of motion. Normal strength. Normal pulse.       Arms:     Comments: "+" empty can  test Decreased ROM with anterior raising, Lateral raising Normal ROM with posterior raising   Neurological:     General: No focal deficit present.     Mental Status: She is alert.     Deep Tendon Reflexes:     Reflex Scores:      Bicep reflexes are 2+ on the right side and 2+ on the left side.    Comments: Bilateral upper extremity strength 5/5    No results found for any visits on 10/01/22.      Assessment & Plan:   Problem List Items Addressed This Visit       Cardiovascular and Mediastinum   Essential hypertension, benign - Primary    Patient currently tolerating medications.  Blood pressure did come down on recheck still above goal.  Follow-up with primary care provider        Other   Acute pain of left shoulder    No injury.  Likely overuse injury.  Will treat with prednisone taper.  Patient avoid NSAIDs while on steroids.  Follow-up if no improvement.  Defer imaging at this  juncture      Relevant Medications   predniSONE (DELTASONE) 20 MG tablet    Meds ordered this encounter  Medications  . predniSONE (DELTASONE) 20 MG tablet    Sig: Take 1 tablet (20 mg total) by mouth 2 (two) times daily with a meal for 4 days, THEN 1 tablet (20 mg total) daily with breakfast for 4 days. Avoid NSAIDS like ibuprofen, aleve, motrin, naproxen, BC/Goody powder.    Dispense:  12 tablet    Refill:  0    Order Specific Question:   Supervising Provider    Answer:   TOWER, MARNE A [[id number]    Return if symptoms worsen or fail to improve.  Romilda Garret, NP

## 2022-10-01 NOTE — Patient Instructions (Addendum)
Nice to see you today Rest the joint. Take tylenol if you need extra pain relief. You can take 1,'000mg'$  every 8 hours if you need it. Continue using the topical medication if it helps.  Follow up if no improvement

## 2022-10-03 DIAGNOSIS — H1131 Conjunctival hemorrhage, right eye: Secondary | ICD-10-CM | POA: Diagnosis not present

## 2022-10-21 DIAGNOSIS — M25512 Pain in left shoulder: Secondary | ICD-10-CM | POA: Diagnosis not present

## 2022-10-21 DIAGNOSIS — M7532 Calcific tendinitis of left shoulder: Secondary | ICD-10-CM | POA: Diagnosis not present

## 2023-01-02 ENCOUNTER — Other Ambulatory Visit: Payer: Self-pay | Admitting: Internal Medicine

## 2023-01-02 DIAGNOSIS — Z1231 Encounter for screening mammogram for malignant neoplasm of breast: Secondary | ICD-10-CM

## 2023-02-25 ENCOUNTER — Ambulatory Visit (INDEPENDENT_AMBULATORY_CARE_PROVIDER_SITE_OTHER): Payer: Medicare HMO | Admitting: Internal Medicine

## 2023-02-25 ENCOUNTER — Encounter: Payer: Self-pay | Admitting: Internal Medicine

## 2023-02-25 VITALS — BP 158/90 | HR 77 | Temp 97.8°F | Ht 60.0 in | Wt 148.0 lb

## 2023-02-25 DIAGNOSIS — Z1211 Encounter for screening for malignant neoplasm of colon: Secondary | ICD-10-CM | POA: Diagnosis not present

## 2023-02-25 DIAGNOSIS — J301 Allergic rhinitis due to pollen: Secondary | ICD-10-CM | POA: Diagnosis not present

## 2023-02-25 DIAGNOSIS — I10 Essential (primary) hypertension: Secondary | ICD-10-CM

## 2023-02-25 DIAGNOSIS — E785 Hyperlipidemia, unspecified: Secondary | ICD-10-CM | POA: Diagnosis not present

## 2023-02-25 DIAGNOSIS — Z0000 Encounter for general adult medical examination without abnormal findings: Secondary | ICD-10-CM | POA: Diagnosis not present

## 2023-02-25 DIAGNOSIS — M25561 Pain in right knee: Secondary | ICD-10-CM | POA: Diagnosis not present

## 2023-02-25 LAB — COMPREHENSIVE METABOLIC PANEL
ALT: 15 U/L (ref 0–35)
AST: 17 U/L (ref 0–37)
Albumin: 4.3 g/dL (ref 3.5–5.2)
Alkaline Phosphatase: 95 U/L (ref 39–117)
BUN: 13 mg/dL (ref 6–23)
CO2: 31 mEq/L (ref 19–32)
Calcium: 9.5 mg/dL (ref 8.4–10.5)
Chloride: 102 mEq/L (ref 96–112)
Creatinine, Ser: 0.6 mg/dL (ref 0.40–1.20)
GFR: 91.79 mL/min (ref >60.00)
Glucose, Bld: 96 mg/dL (ref 70–99)
Potassium: 4.4 mEq/L (ref 3.5–5.1)
Sodium: 141 mEq/L (ref 135–145)
Total Bilirubin: 1.2 mg/dL (ref 0.2–1.2)
Total Protein: 6.8 g/dL (ref 6.0–8.3)

## 2023-02-25 LAB — LIPID PANEL
Cholesterol: 250 mg/dL — ABNORMAL HIGH (ref 0–200)
HDL: 62.1 mg/dL (ref >39.00)
NonHDL: 188.15
Total CHOL/HDL Ratio: 4
Triglycerides: 206 mg/dL — ABNORMAL HIGH (ref 0.0–149.0)
VLDL: 41.2 mg/dL — ABNORMAL HIGH (ref 0.0–40.0)

## 2023-02-25 LAB — CBC
HCT: 43 % (ref 36.0–46.0)
Hemoglobin: 14.2 g/dL (ref 12.0–15.0)
MCHC: 33 g/dL (ref 30.0–36.0)
MCV: 91.9 fl (ref 78.0–100.0)
Platelets: 302 10*3/uL (ref 150.0–400.0)
RBC: 4.68 Mil/uL (ref 3.87–5.11)
RDW: 13.1 % (ref 11.5–15.5)
WBC: 5.3 10*3/uL (ref 4.0–10.5)

## 2023-02-25 LAB — LDL CHOLESTEROL, DIRECT: Direct LDL: 161 mg/dL

## 2023-02-25 NOTE — Assessment & Plan Note (Addendum)
BP Readings from Last 3 Encounters:  02/25/23 (!) 158/90  10/01/22 (!) 150/88  02/20/22 140/88   Asked her to check at home and let me know if over 140/90 Feels it is high due to her current back pain

## 2023-02-25 NOTE — Progress Notes (Signed)
Subjective:    Patient ID: Catherine Dalton, female    DOB: [birth year], 67 y.o.   MRN: [id number]  HPI Here for Medicare wellness visit and follow up of chronic health conditions Reviewed advanced directives Reviewed other doctors--Dr Bartholome Bill, [facility], LTR dental No hospitalizations or surgery in the past year Doing a lot of yard work---rototiller for large garden, just laid lots of stone, etc Vision is fine Hearing aides--they help No alcohol (or very rare wine) No tobacco Did fall when working in yard--backed over a log. No injuries No depression or anhedonia Independent with instrumental ADLs No sig memory issues  Has continuous right low back pain Hard to sit at times--better when moving around  Uses the cetrizine prn for allergies ALWAY for her eyes as needed  High cholesterol noted She isn't excited about treatment  Current Outpatient Medications on File Prior to Visit  Medication Sig Dispense Refill   aspirin 325 MG tablet Take 325 mg by mouth daily as needed. For pain     cetirizine (ZYRTEC) 10 MG tablet Take 10 mg by mouth daily as needed.     No current facility-administered medications on file prior to visit.    No Known Allergies  Past Medical History:  Diagnosis Date   Allergic rhinitis due to pollen    Essential hypertension, benign 2010   didn't tolerate meds    Past Surgical History:  Procedure Laterality Date   ABDOMINAL HYSTERECTOMY     Fibroids   ANKLE FRACTURE SURGERY  1987   BASAL CELL CARCINOMA EXCISION  2014   Face   Carotid duplex  02/10/13   Normal   CARPAL TUNNEL RELEASE  2016   Dr Bryson Corona test  2014   Normal   TUBAL LIGATION      Family History  Problem Relation Age of Onset   Heart disease Mother    Stroke Mother    Hypertension Mother    Hyperlipidemia Mother    Cancer Sister        breast cancer   Asthma Father    Heart disease Brother    Cancer Paternal Grandmother        colon  cancer   Autoimmune disease Other     Social History   Socioeconomic History   Marital status: Married    Spouse name: Not on file   Number of children: 3   Years of education: Not on file   Highest education level: Not on file  Occupational History   Occupation: [employer]    Comment: Retired  Tobacco Use   Smoking status: Former    Passive exposure: Current   Smokeless tobacco: Never  Substance and Sexual Activity   Alcohol use: Yes    Alcohol/week: 0.0 standard drinks of alcohol    Comment: rare glass of wine   Drug use: No   Sexual activity: Yes  Other Topics Concern   Not on file  Social History Narrative   Has living will   Husband is health care POA. Sister Ferne Reus is alternate   Would accept resuscitation but no prolonged ventilation   No tube feeds if cognitively unaware   Social Determinants of Health   Financial Resource Strain: Medium Risk (06/16/2021)   Overall Financial Resource Strain (CARDIA)    Difficulty of Paying Living Expenses: Somewhat hard  Food Insecurity: No Food Insecurity (06/16/2021)   Hunger Vital Sign    Worried About Running Out of Food in the  Last Year: Never true    Ran Out of Food in the Last Year: Never true  Transportation Needs: No Transportation Needs (06/16/2021)   PRAPARE - Administrator, Civil Service (Medical): No    Lack of Transportation (Non-Medical): No  Physical Activity: Insufficiently Active (06/16/2021)   Exercise Vital Sign    Days of Exercise per Week: 3 days    Minutes of Exercise per Session: 20 min  Stress: No Stress Concern Present (06/16/2021)   [employer] of Occupational Health - Occupational Stress Questionnaire    Feeling of Stress : Not at all  Social Connections: Socially Integrated (06/16/2021)   Social Connection and Isolation Panel [NHANES]    Frequency of Communication with Friends and Family: More than three times a week    Frequency of Social Gatherings with Friends and  Family: Three times a week    Attends Religious Services: More than 4 times per year    Active Member of Clubs or Organizations: Yes    Attends Banker Meetings: More than 4 times per year    Marital Status: Married  Catering manager Violence: Not At Risk (06/16/2021)   Humiliation, Afraid, Rape, and Kick questionnaire    Fear of Current or Ex-Partner: No    Emotionally Abused: No    Physically Abused: No    Sexually Abused: No   Review of Systems Appetite is great Weight is stable Sleeps well Wears seat belt Had chip on tooth repaired No suspicious skin lesions--dermatologist left (will get new one) Rare heartburn--if eats late. Does have trouble swallowing if she doesn't take small bites No chest pain or SOB No dizziness or syncope No edema Bowels move fine--no blood Voids okay--occ stress incontinence if she overfills bladder No other significant joint issues    Objective:   Physical Exam Constitutional:      Appearance: Normal appearance.  HENT:     Mouth/Throat:     Pharynx: No oropharyngeal exudate or posterior oropharyngeal erythema.  Eyes:     Conjunctiva/sclera: Conjunctivae normal.     Pupils: Pupils are equal, round, and reactive to light.  Cardiovascular:     Rate and Rhythm: Normal rate and regular rhythm.     Pulses: Normal pulses.     Heart sounds: No murmur heard.    No gallop.  Pulmonary:     Effort: Pulmonary effort is normal.     Breath sounds: Normal breath sounds. No wheezing or rales.  Abdominal:     Palpations: Abdomen is soft.     Tenderness: There is no abdominal tenderness.  Musculoskeletal:     Cervical back: Neck supple.     Right lower leg: No edema.     Left lower leg: No edema.  Lymphadenopathy:     Cervical: No cervical adenopathy.  Skin:    Findings: No rash.  Neurological:     General: No focal deficit present.     Mental Status: She is alert and oriented to person, place, and time.     Comments: Word naming 15/30  seconds Recall 3/3  Psychiatric:        Mood and Affect: Mood normal.        Behavior: Behavior normal.            Assessment & Plan:

## 2023-02-25 NOTE — Assessment & Plan Note (Signed)
I have personally reviewed the Medicare Annual Wellness questionnaire and have noted 1. The patient's medical and social history 2. Their use of alcohol, tobacco or illicit drugs 3. Their current medications and supplements 4. The patient's functional ability including ADL's, fall risks, home safety risks and hearing or visual             impairment. 5. Diet and physical activities 6. Evidence for depression or mood disorders  The patients weight, height, BMI and visual acuity have been recorded in the chart I have made referrals, counseling and provided education to the patient based review of the above and I have provided the pt with a written personalized care plan for preventive services.  I have provided you with a copy of your personalized plan for preventive services. Please take the time to review along with your updated medication list.  Stays active FIT again Begged her to get a mammogram Flu/RSV in the fall Updated COVID vaccine

## 2023-02-25 NOTE — Assessment & Plan Note (Signed)
Satisfied with cetirizine 

## 2023-02-25 NOTE — Assessment & Plan Note (Signed)
Doesn't want statin for primary prevention

## 2023-03-13 ENCOUNTER — Other Ambulatory Visit (INDEPENDENT_AMBULATORY_CARE_PROVIDER_SITE_OTHER): Payer: Medicare HMO | Admitting: *Deleted

## 2023-03-13 DIAGNOSIS — Z1211 Encounter for screening for malignant neoplasm of colon: Secondary | ICD-10-CM

## 2023-03-18 LAB — FECAL OCCULT BLOOD, IMMUNOCHEMICAL: Fecal Occult Bld: NEGATIVE

## 2023-04-08 DIAGNOSIS — S63592A Other specified sprain of left wrist, initial encounter: Secondary | ICD-10-CM | POA: Diagnosis not present

## 2023-04-08 DIAGNOSIS — M25532 Pain in left wrist: Secondary | ICD-10-CM | POA: Diagnosis not present

## 2024-02-26 ENCOUNTER — Encounter: Payer: Self-pay | Admitting: Internal Medicine

## 2024-02-26 ENCOUNTER — Ambulatory Visit: Payer: Medicare HMO | Admitting: Internal Medicine

## 2024-02-26 ENCOUNTER — Ambulatory Visit: Payer: Self-pay | Admitting: Internal Medicine

## 2024-02-26 VITALS — BP 156/86 | HR 89 | Temp 98.6°F | Ht 60.0 in | Wt 150.0 lb

## 2024-02-26 DIAGNOSIS — R5383 Other fatigue: Secondary | ICD-10-CM | POA: Diagnosis not present

## 2024-02-26 DIAGNOSIS — J301 Allergic rhinitis due to pollen: Secondary | ICD-10-CM

## 2024-02-26 DIAGNOSIS — E785 Hyperlipidemia, unspecified: Secondary | ICD-10-CM

## 2024-02-26 DIAGNOSIS — I10 Essential (primary) hypertension: Secondary | ICD-10-CM

## 2024-02-26 DIAGNOSIS — K2100 Gastro-esophageal reflux disease with esophagitis, without bleeding: Secondary | ICD-10-CM

## 2024-02-26 DIAGNOSIS — Z0000 Encounter for general adult medical examination without abnormal findings: Secondary | ICD-10-CM

## 2024-02-26 DIAGNOSIS — Z1211 Encounter for screening for malignant neoplasm of colon: Secondary | ICD-10-CM

## 2024-02-26 DIAGNOSIS — K219 Gastro-esophageal reflux disease without esophagitis: Secondary | ICD-10-CM | POA: Insufficient documentation

## 2024-02-26 LAB — VITAMIN B12: Vitamin B-12: 371 pg/mL (ref 211–911)

## 2024-02-26 LAB — LIPID PANEL
Cholesterol: 299 mg/dL — ABNORMAL HIGH (ref 0–200)
HDL: 66.2 mg/dL (ref >39.00)
LDL Cholesterol: 168 mg/dL — ABNORMAL HIGH (ref 0–99)
NonHDL: 232.6
Total CHOL/HDL Ratio: 5
Triglycerides: 321 mg/dL — ABNORMAL HIGH (ref 0.0–149.0)
VLDL: 64.2 mg/dL — ABNORMAL HIGH (ref 0.0–40.0)

## 2024-02-26 LAB — COMPREHENSIVE METABOLIC PANEL WITH GFR
ALT: 18 U/L (ref 0–35)
AST: 17 U/L (ref 0–37)
Albumin: 4.5 g/dL (ref 3.5–5.2)
Alkaline Phosphatase: 107 U/L (ref 39–117)
BUN: 10 mg/dL (ref 6–23)
CO2: 28 meq/L (ref 19–32)
Calcium: 9.4 mg/dL (ref 8.4–10.5)
Chloride: 102 meq/L (ref 96–112)
Creatinine, Ser: 0.64 mg/dL (ref 0.40–1.20)
GFR: 89.73 mL/min (ref >60.00)
Glucose, Bld: 110 mg/dL — ABNORMAL HIGH (ref 70–99)
Potassium: 4.1 meq/L (ref 3.5–5.1)
Sodium: 139 meq/L (ref 135–145)
Total Bilirubin: 1.1 mg/dL (ref 0.2–1.2)
Total Protein: 7.3 g/dL (ref 6.0–8.3)

## 2024-02-26 LAB — CBC
HCT: 43.8 % (ref 36.0–46.0)
Hemoglobin: 14.6 g/dL (ref 12.0–15.0)
MCHC: 33.4 g/dL (ref 30.0–36.0)
MCV: 89.3 fl (ref 78.0–100.0)
Platelets: 279 10*3/uL (ref 150.0–400.0)
RBC: 4.9 Mil/uL (ref 3.87–5.11)
RDW: 13.2 % (ref 11.5–15.5)
WBC: 5.1 10*3/uL (ref 4.0–10.5)

## 2024-02-26 LAB — TSH: TSH: 0.79 u[IU]/mL (ref 0.35–5.50)

## 2024-02-26 MED ORDER — AMLODIPINE BESYLATE 5 MG PO TABS: 5.0000 mg | ORAL_TABLET | Freq: Every day | ORAL | 3 refills | Status: AC

## 2024-02-26 NOTE — Assessment & Plan Note (Signed)
 Given some dysphagia--recommended famotidine 20mg  at bedtime

## 2024-02-26 NOTE — Progress Notes (Signed)
 Subjective:    Patient ID: Catherine Dalton, female    DOB: [birth year], 68 y.o.   MRN: [id number]  HPI Here for Medicare wellness visit and follow up of chronic health conditions Reviewed advanced directives Reviewed other doctors----Drs Kendall/Xr/Dasnoit---ortho, [facility], Dr Jodene Munro No hospitalizations or surgery in the past year Stays active around house--no set exercise. Thinking about going to [facility] Vision is okay Hearing is not great---has aides but rarely uses them No alcohol or tobacco No falls No depression or anhedonia Independent with instrumental ADLs No sig memory issues--but some recall issues  Feels tired a lot When gardening--may feel tired. Will fall asleep when crocheting Generally sleeps okay--no known apnea Not tired if physically working Has been stressed with lots of work---relining crawlspace, duct work, etc  Hasn't checked her BP at home lately Generally 140-165/70-80's No chest pain or SOB No dizziness or syncope No edema No palpitations  Known high cholesterol Prefers no statin  Some shooting pain in left great toe--sounds mechanical (some loose shoes)  Current Outpatient Medications on File Prior to Visit  Medication Sig Dispense Refill   aspirin 325 MG tablet Take 325 mg by mouth daily as needed. For pain     cetirizine (ZYRTEC) 10 MG tablet Take 10 mg by mouth daily as needed.     No current facility-administered medications on file prior to visit.    Allergies  Allergen Reactions   Other Other (See Comments)    Sneezing, running nose itchy eyes.    Past Medical History:  Diagnosis Date   Allergic rhinitis due to pollen    Essential hypertension, benign 2010   didn't tolerate meds    Past Surgical History:  Procedure Laterality Date   ABDOMINAL HYSTERECTOMY     Fibroids   ANKLE FRACTURE SURGERY  1987   BASAL CELL CARCINOMA EXCISION  2014   Face   Carotid duplex  02/10/13   Normal   CARPAL TUNNEL RELEASE   2016   Dr Arleene Belt test  2014   Normal   TUBAL LIGATION      Family History  Problem Relation Age of Onset   Heart disease Mother    Stroke Mother    Hypertension Mother    Hyperlipidemia Mother    Cancer Sister        breast cancer   Asthma Father    Heart disease Brother    Cancer Paternal Grandmother        colon cancer   Autoimmune disease Other     Social History   Socioeconomic History   Marital status: Married    Spouse name: Not on file   Number of children: 3   Years of education: Not on file   Highest education level: Not on file  Occupational History   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 Sister Leartis Proud is alternate   Would accept resuscitation but no prolonged ventilation   No tube feeds if cognitively unaware   Social Drivers of  Health   Financial Resource Strain: Medium Risk (06/16/2021)   Overall Financial Resource Strain (CARDIA)    Difficulty of Paying Living Expenses: Somewhat hard  Food Insecurity: No Food Insecurity (06/16/2021)   Hunger Vital Sign    Worried About Running Out of Food in the Last Year: Never true    Ran Out of Food in the Last Year: Never true  Transportation Needs: No Transportation Needs (06/16/2021)   PRAPARE - Administrator, Civil Service (Medical): No    Lack of Transportation (Non-Medical): No  Physical Activity: Insufficiently Active (06/16/2021)   Exercise Vital Sign    Days of Exercise per Week: 3 days    Minutes of Exercise per Session: 20 min  Stress: No Stress Concern Present (06/16/2021)   [employer] of Occupational Health - Occupational Stress Questionnaire    Feeling of Stress : Not at all  Social Connections: Socially Integrated (06/16/2021)   Social Connection and Isolation Panel [NHANES]    Frequency of Communication with Friends and Family: More than three times a week    Frequency of Social Gatherings with Friends and Family: Three times a week    Attends Religious Services: More than 4 times per year    Active Member of Clubs or Organizations: Yes    Attends Banker Meetings: More than 4 times per year    Marital Status: Married  Catering manager Violence: Not At Risk (06/16/2021)   Humiliation, Afraid, Rape, and Kick questionnaire    Fear of Current or Ex-Partner: No    Emotionally Abused: No    Physically Abused: No    Sexually Abused: No   Review of Systems Appetite is great Weight is stable Wears seat belt Teeth all fixed---crowns Occasional heartburn---if she eats late. Rare tums. Some dysphagia for years--crackers or bread Bowels move fine--no blood No urinary problems Had spell with sciatica---better now with massage every 3 weeks No suspicious skin lesions     Objective:   Physical Exam Constitutional:      Appearance: Normal appearance.  HENT:     Mouth/Throat:     Pharynx: No oropharyngeal exudate or posterior oropharyngeal erythema.  Eyes:     Conjunctiva/sclera: Conjunctivae normal.     Pupils: Pupils are equal, round, and reactive to light.  Cardiovascular:     Rate and Rhythm: Normal rate and regular rhythm.     Pulses: Normal pulses.     Heart sounds: No murmur heard.    No gallop.  Pulmonary:     Effort: Pulmonary effort is normal.     Breath sounds: Normal breath sounds. No wheezing or rales.  Abdominal:     Palpations: Abdomen is soft.     Tenderness: There is no abdominal tenderness.  Musculoskeletal:     Cervical back: Neck supple.     Right lower leg: No edema.     Left lower leg: No edema.  Lymphadenopathy:      Cervical: No cervical adenopathy.  Skin:    Findings: No rash.  Neurological:     General: No focal deficit present.     Mental Status: She is alert and oriented to person, place, and time.     Comments: Mini-cog---normal  Psychiatric:        Mood and Affect: Mood normal.        Behavior: Behavior normal.            Assessment & Plan:

## 2024-02-26 NOTE — Assessment & Plan Note (Signed)
 I have personally reviewed the Medicare Annual Wellness questionnaire and have noted 1. The patient's medical and social history 2. Their use of alcohol, tobacco or illicit drugs 3. Their current medications and supplements 4. The patient's functional ability including ADL's, fall risks, home safety risks and hearing or visual             impairment. 5. Diet and physical activities 6. Evidence for depression or mood disorders  The patients weight, height, BMI and visual acuity have been recorded in the chart I have made referrals, counseling and provided education to the patient based review of the above and I have provided the pt with a written personalized care plan for preventive services.  I have provided you with a copy of your personalized plan for preventive services. Please take the time to review along with your updated medication list.  Urged her to get mammogram--very resistant FIT Stays active Flu/COVID updates in the fall

## 2024-02-26 NOTE — Assessment & Plan Note (Signed)
Does okay with zyrtec

## 2024-02-26 NOTE — Assessment & Plan Note (Signed)
 Vague Will check labs Would set up sleep evaluation if ongoing or worsened problems

## 2024-02-26 NOTE — Assessment & Plan Note (Signed)
 BP Readings from Last 3 Encounters:  02/26/24 (!) 156/86  02/25/23 (!) 158/90  10/01/22 (!) 150/88   Persistent elevation Will try amlodipine 5mg  daily--and she will monitor at home

## 2024-02-26 NOTE — Assessment & Plan Note (Signed)
Still prefers no statin 

## 2024-05-04 DIAGNOSIS — M25571 Pain in right ankle and joints of right foot: Secondary | ICD-10-CM | POA: Diagnosis not present

## 2024-07-22 NOTE — Progress Notes (Signed)
 Catherine Dalton                                          MRN: [id number]   07/22/2024   The [facility] Quality Team Specialist reviewed this patient medical record for the purposes of chart review for care gap closure. The following were reviewed: chart review for care gap closure-controlling blood pressure.    [facility] Quality Team

## 2024-08-20 DIAGNOSIS — Z1212 Encounter for screening for malignant neoplasm of rectum: Secondary | ICD-10-CM | POA: Diagnosis not present

## 2024-08-26 LAB — COLOGUARD: COLOGUARD: NEGATIVE

## 2024-08-30 ENCOUNTER — Encounter: Payer: Self-pay | Admitting: Internal Medicine

## 2024-09-14 DIAGNOSIS — H52223 Regular astigmatism, bilateral: Secondary | ICD-10-CM | POA: Diagnosis not present

## 2024-09-14 DIAGNOSIS — H1131 Conjunctival hemorrhage, right eye: Secondary | ICD-10-CM | POA: Diagnosis not present

## 2024-10-11 LAB — COLOGUARD: Cologuard: NEGATIVE

## 2024-10-28 NOTE — Progress Notes (Signed)
 Catherine Dalton                                          MRN: [id number]   10/28/2024   The [facility] Quality Team Specialist reviewed this patient medical record for the purposes of chart review for care gap closure. The following were reviewed: chart review for care gap closure-controlling blood pressure.    [facility] Quality Team

## 2024-11-02 NOTE — Progress Notes (Signed)
 Catherine Dalton                                          MRN: [id number]   11/02/2024   The [facility] Quality Team Specialist reviewed this patient medical record for the purposes of chart review for care gap closure. The following were reviewed: abstraction for care gap closure-colorectal cancer screening.    [facility] Quality Team

## 2024-11-02 NOTE — Progress Notes (Signed)
 Catherine Dalton                                          MRN: [id number]   11/02/2024   The [facility] Quality Team Specialist reviewed this patient medical record for the purposes of chart review for care gap closure. The following were reviewed: chart review for care gap closure-breast cancer screening.    [facility] Quality Team

## 2025-02-28 ENCOUNTER — Encounter
